# Patient Record
Sex: Male | Born: 1955 | Hispanic: No | Marital: Married | Smoking: Never smoker
Health system: Southern US, Community
[De-identification: ages and names within clinical notes are randomized; demographics above are authoritative.]

## PROBLEM LIST (undated history)

## (undated) DIAGNOSIS — I2510 Atherosclerotic heart disease of native coronary artery without angina pectoris: Secondary | ICD-10-CM

## (undated) DIAGNOSIS — I509 Heart failure, unspecified: Secondary | ICD-10-CM

## (undated) DIAGNOSIS — I10 Essential (primary) hypertension: Secondary | ICD-10-CM

## (undated) DIAGNOSIS — E119 Type 2 diabetes mellitus without complications: Secondary | ICD-10-CM

## (undated) HISTORY — PX: NO PAST SURGERIES: SHX2092

---

## 2006-03-03 ENCOUNTER — Emergency Department (HOSPITAL_COMMUNITY): Admission: EM | Admit: 2006-03-03 | Discharge: 2006-03-04 | Payer: Self-pay | Admitting: Emergency Medicine

## 2013-06-25 ENCOUNTER — Encounter (HOSPITAL_COMMUNITY): Payer: Self-pay | Admitting: Emergency Medicine

## 2013-06-25 ENCOUNTER — Inpatient Hospital Stay (HOSPITAL_COMMUNITY)
Admission: EM | Admit: 2013-06-25 | Discharge: 2013-06-27 | DRG: 291 | Disposition: A | Discharge status: Home / Self Care | Payer: BC Managed Care – PPO | Attending: Cardiovascular Disease | Admitting: Cardiovascular Disease

## 2013-06-25 ENCOUNTER — Other Ambulatory Visit: Payer: Self-pay

## 2013-06-25 ENCOUNTER — Emergency Department (HOSPITAL_COMMUNITY): Payer: BC Managed Care – PPO

## 2013-06-25 DIAGNOSIS — I2510 Atherosclerotic heart disease of native coronary artery without angina pectoris: Secondary | ICD-10-CM | POA: Diagnosis present

## 2013-06-25 DIAGNOSIS — E119 Type 2 diabetes mellitus without complications: Secondary | ICD-10-CM | POA: Diagnosis present

## 2013-06-25 DIAGNOSIS — E860 Dehydration: Secondary | ICD-10-CM | POA: Diagnosis present

## 2013-06-25 DIAGNOSIS — J189 Pneumonia, unspecified organism: Secondary | ICD-10-CM | POA: Diagnosis present

## 2013-06-25 DIAGNOSIS — I309 Acute pericarditis, unspecified: Secondary | ICD-10-CM | POA: Diagnosis present

## 2013-06-25 DIAGNOSIS — I5023 Acute on chronic systolic (congestive) heart failure: Principal | ICD-10-CM | POA: Diagnosis present

## 2013-06-25 DIAGNOSIS — I129 Hypertensive chronic kidney disease with stage 1 through stage 4 chronic kidney disease, or unspecified chronic kidney disease: Secondary | ICD-10-CM | POA: Diagnosis present

## 2013-06-25 DIAGNOSIS — IMO0001 Reserved for inherently not codable concepts without codable children: Secondary | ICD-10-CM | POA: Diagnosis present

## 2013-06-25 DIAGNOSIS — I498 Other specified cardiac arrhythmias: Secondary | ICD-10-CM | POA: Diagnosis not present

## 2013-06-25 DIAGNOSIS — I509 Heart failure, unspecified: Secondary | ICD-10-CM | POA: Diagnosis present

## 2013-06-25 DIAGNOSIS — N182 Chronic kidney disease, stage 2 (mild): Secondary | ICD-10-CM | POA: Diagnosis present

## 2013-06-25 DIAGNOSIS — Z9119 Patient's noncompliance with other medical treatment and regimen: Secondary | ICD-10-CM | POA: Diagnosis present

## 2013-06-25 DIAGNOSIS — I059 Rheumatic mitral valve disease, unspecified: Secondary | ICD-10-CM | POA: Diagnosis present

## 2013-06-25 DIAGNOSIS — R5381 Other malaise: Secondary | ICD-10-CM | POA: Diagnosis present

## 2013-06-25 DIAGNOSIS — Z91199 Patient's noncompliance with other medical treatment and regimen due to unspecified reason: Secondary | ICD-10-CM | POA: Diagnosis present

## 2013-06-25 DIAGNOSIS — R188 Other ascites: Secondary | ICD-10-CM | POA: Diagnosis present

## 2013-06-25 DIAGNOSIS — I428 Other cardiomyopathies: Secondary | ICD-10-CM | POA: Diagnosis present

## 2013-06-25 DIAGNOSIS — I079 Rheumatic tricuspid valve disease, unspecified: Secondary | ICD-10-CM | POA: Diagnosis present

## 2013-06-25 DIAGNOSIS — N289 Disorder of kidney and ureter, unspecified: Secondary | ICD-10-CM | POA: Diagnosis present

## 2013-06-25 HISTORY — DX: Type 2 diabetes mellitus without complications: E11.9

## 2013-06-25 HISTORY — DX: Atherosclerotic heart disease of native coronary artery without angina pectoris: I25.10

## 2013-06-25 HISTORY — DX: Essential (primary) hypertension: I10

## 2013-06-25 HISTORY — DX: Heart failure, unspecified: I50.9

## 2013-06-25 LAB — CBC WITH DIFFERENTIAL/PLATELET
Basophils Absolute: 0 10*3/uL (ref 0.0–0.1)
Basophils Relative: 0 % (ref 0–1)
Eosinophils Absolute: 0.3 10*3/uL (ref 0.0–0.7)
Eosinophils Relative: 3 % (ref 0–5)
HCT: 38.5 % — ABNORMAL LOW (ref 39.0–52.0)
Hemoglobin: 13.2 g/dL (ref 13.0–17.0)
Lymphocytes Relative: 15 % (ref 12–46)
Lymphs Abs: 1.5 10*3/uL (ref 0.7–4.0)
MCH: 32.9 pg (ref 26.0–34.0)
MCHC: 34.3 g/dL (ref 30.0–36.0)
MCV: 96 fL (ref 78.0–100.0)
Monocytes Absolute: 0.5 10*3/uL (ref 0.1–1.0)
Monocytes Relative: 5 % (ref 3–12)
Neutro Abs: 7.3 10*3/uL (ref 1.7–7.7)
Neutrophils Relative %: 76 % (ref 43–77)
Platelets: 311 10*3/uL (ref 150–400)
RBC: 4.01 MIL/uL — ABNORMAL LOW (ref 4.22–5.81)
RDW: 15.8 % — ABNORMAL HIGH (ref 11.5–15.5)
WBC: 9.7 10*3/uL (ref 4.0–10.5)

## 2013-06-25 LAB — URINALYSIS, ROUTINE W REFLEX MICROSCOPIC
Bilirubin Urine: NEGATIVE
Glucose, UA: 100 mg/dL — AB
Ketones, ur: NEGATIVE mg/dL
Leukocytes, UA: NEGATIVE
Nitrite: NEGATIVE
Protein, ur: 100 mg/dL — AB
Specific Gravity, Urine: 1.011 (ref 1.005–1.030)
Urobilinogen, UA: 0.2 mg/dL (ref 0.0–1.0)
pH: 7 (ref 5.0–8.0)

## 2013-06-25 LAB — URINE MICROSCOPIC-ADD ON

## 2013-06-25 LAB — COMPREHENSIVE METABOLIC PANEL
ALT: 10 U/L (ref 0–53)
AST: 13 U/L (ref 0–37)
Albumin: 3.7 g/dL (ref 3.5–5.2)
Alkaline Phosphatase: 83 U/L (ref 39–117)
BUN: 11 mg/dL (ref 6–23)
CO2: 26 mEq/L (ref 19–32)
Calcium: 9 mg/dL (ref 8.4–10.5)
Chloride: 102 mEq/L (ref 96–112)
Creatinine, Ser: 1.28 mg/dL (ref 0.50–1.35)
GFR calc Af Amer: 69 mL/min — ABNORMAL LOW (ref >90)
GFR calc non Af Amer: 60 mL/min — ABNORMAL LOW (ref >90)
Glucose, Bld: 133 mg/dL — ABNORMAL HIGH (ref 70–99)
Potassium: 3.7 mEq/L (ref 3.5–5.1)
Sodium: 138 mEq/L (ref 135–145)
Total Bilirubin: 1.4 mg/dL — ABNORMAL HIGH (ref 0.3–1.2)
Total Protein: 7.8 g/dL (ref 6.0–8.3)

## 2013-06-25 LAB — PRO B NATRIURETIC PEPTIDE: Pro B Natriuretic peptide (BNP): 1691 pg/mL — ABNORMAL HIGH (ref 0–125)

## 2013-06-25 LAB — HEMOGLOBIN A1C: Mean Plasma Glucose: 169 mg/dL — ABNORMAL HIGH (ref <117)

## 2013-06-25 LAB — GLUCOSE, CAPILLARY

## 2013-06-25 LAB — TROPONIN I: Troponin I: <0.3 ng/mL (ref <0.30)

## 2013-06-25 MED ORDER — FUROSEMIDE 10 MG/ML IJ SOLN
60.0000 mg | Freq: Once | INTRAMUSCULAR | Status: AC
  Administered 2013-06-25: 60 mg via INTRAVENOUS
  Filled 2013-06-25 (×2): qty 6

## 2013-06-25 MED ORDER — POTASSIUM CHLORIDE CRYS ER 20 MEQ PO TBCR
20.0000 meq | EXTENDED_RELEASE_TABLET | Freq: Once | ORAL | Status: AC
  Administered 2013-06-25: 20 meq via ORAL
  Filled 2013-06-25: qty 1

## 2013-06-25 MED ORDER — HEPARIN SODIUM (PORCINE) 5000 UNIT/ML IJ SOLN
5000.0000 [IU] | Freq: Three times a day (TID) | INTRAMUSCULAR | Status: DC
  Administered 2013-06-25 – 2013-06-27 (×5): 5000 [IU] via SUBCUTANEOUS
  Filled 2013-06-25 (×8): qty 1

## 2013-06-25 MED ORDER — INSULIN ASPART 100 UNIT/ML ~~LOC~~ SOLN
0.0000 [IU] | Freq: Three times a day (TID) | SUBCUTANEOUS | Status: DC
  Administered 2013-06-25: 17:00:00 2 [IU] via SUBCUTANEOUS
  Administered 2013-06-26: 12:00:00 5 [IU] via SUBCUTANEOUS
  Administered 2013-06-26 (×2): 3 [IU] via SUBCUTANEOUS

## 2013-06-25 MED ORDER — FUROSEMIDE 10 MG/ML IJ SOLN
40.0000 mg | Freq: Two times a day (BID) | INTRAMUSCULAR | Status: DC
  Administered 2013-06-25 – 2013-06-27 (×4): 40 mg via INTRAVENOUS
  Filled 2013-06-25 (×6): qty 4

## 2013-06-25 MED ORDER — INSULIN GLARGINE 100 UNIT/ML ~~LOC~~ SOLN
10.0000 [IU] | Freq: Every day | SUBCUTANEOUS | Status: DC
  Administered 2013-06-25: 10 [IU] via SUBCUTANEOUS
  Filled 2013-06-25 (×2): qty 0.1

## 2013-06-25 MED ORDER — SODIUM CHLORIDE 0.9 % IJ SOLN: 3.0000 mL | INTRAMUSCULAR | Status: DC | PRN

## 2013-06-25 MED ORDER — SODIUM CHLORIDE 0.9 % IV SOLN: 250.0000 mL | INTRAVENOUS | Status: DC | PRN

## 2013-06-25 MED ORDER — DIGOXIN 250 MCG PO TABS
0.2500 mg | ORAL_TABLET | Freq: Every day | ORAL | Status: DC
  Administered 2013-06-25 – 2013-06-27 (×3): 0.25 mg via ORAL
  Filled 2013-06-25 (×3): qty 1

## 2013-06-25 MED ORDER — BENAZEPRIL HCL 40 MG PO TABS
40.0000 mg | ORAL_TABLET | Freq: Every day | ORAL | Status: DC
  Administered 2013-06-25 – 2013-06-27 (×3): 40 mg via ORAL
  Filled 2013-06-25 (×3): qty 1

## 2013-06-25 MED ORDER — POTASSIUM CHLORIDE CRYS ER 20 MEQ PO TBCR
20.0000 meq | EXTENDED_RELEASE_TABLET | Freq: Two times a day (BID) | ORAL | Status: DC
  Administered 2013-06-25 – 2013-06-26 (×2): 20 meq via ORAL
  Filled 2013-06-25 (×4): qty 1

## 2013-06-25 MED ORDER — CARVEDILOL 25 MG PO TABS
25.0000 mg | ORAL_TABLET | Freq: Two times a day (BID) | ORAL | Status: DC
  Administered 2013-06-25 – 2013-06-27 (×4): 25 mg via ORAL
  Filled 2013-06-25 (×6): qty 1

## 2013-06-25 MED ORDER — SPIRONOLACTONE 25 MG PO TABS
25.0000 mg | ORAL_TABLET | Freq: Every day | ORAL | Status: DC
  Administered 2013-06-25 – 2013-06-27 (×3): 25 mg via ORAL
  Filled 2013-06-25 (×3): qty 1

## 2013-06-25 MED ORDER — AMLODIPINE BESYLATE 5 MG PO TABS
5.0000 mg | ORAL_TABLET | Freq: Every day | ORAL | Status: DC
  Administered 2013-06-25: 5 mg via ORAL
  Filled 2013-06-25 (×2): qty 1

## 2013-06-25 MED ORDER — ACETAMINOPHEN 325 MG PO TABS: 650.0000 mg | ORAL_TABLET | ORAL | Status: DC | PRN

## 2013-06-25 MED ORDER — ONDANSETRON HCL 4 MG/2ML IJ SOLN: 4.0000 mg | Freq: Four times a day (QID) | INTRAMUSCULAR | Status: DC | PRN

## 2013-06-25 MED ORDER — SODIUM CHLORIDE 0.9 % IJ SOLN
3.0000 mL | Freq: Two times a day (BID) | INTRAMUSCULAR | Status: DC
Start: 2013-06-25 — End: 2013-06-27
  Administered 2013-06-25 – 2013-06-26 (×4): 3 mL via INTRAVENOUS

## 2013-06-25 MED ORDER — GUAIFENESIN 100 MG/5ML PO SOLN
5.0000 mL | ORAL | Status: DC | PRN
  Administered 2013-06-26 (×2): 100 mg via ORAL
  Filled 2013-06-25 (×2): qty 5

## 2013-06-25 MED ORDER — PNEUMOCOCCAL VAC POLYVALENT 25 MCG/0.5ML IJ INJ
0.5000 mL | INJECTION | INTRAMUSCULAR | Status: AC
  Administered 2013-06-26: 10:00:00 0.5 mL via INTRAMUSCULAR
  Filled 2013-06-25: qty 0.5

## 2013-06-25 NOTE — ED Notes (Signed)
Report given to Lori, RN

## 2013-06-25 NOTE — ED Provider Notes (Signed)
CSN: [id number]     Arrival date & time 06/25/13  1478 History   None    Chief Complaint  Patient presents with  . Weakness  . Shortness of Breath   (Consider location/radiation/quality/duration/timing/severity/associated sxs/prior Treatment) HPI Mr. Caleb [name] is a 57 y.o. [place] male w/ PMHx of CAD, CHF, HTN, DM type II, presents to the ED w/ complaints of weakness and SOB. Patient was seen by Dr. Algie Coffer in his office last weak and the patient looked dehydrated. The patient is accompanied by his daughter who assisted w/ history. The patient claims to have recent fatigue, mostly w/ exertion as well as DOE and dry mouth as well as a mild dry cough. He also states that he has had dizziness and lightheadedness w/ standing recently. He denies any recent vomiting or diarrhea, but does claim to have somewhat decreased po intake recently as he has not been feeling well. He claims to urinate 2-3x per hour, which is apparently normal for him. After speaking w/ Dr. Algie Coffer, the patient is non-compliant w/ his insulin regimen and frequently has very high sugars, most likely contributing to his possible polyuria. The patient also complains of PND and 2-3 pillow orthopnea, not a new complaint for him.  Past Medical History  Diagnosis Date  . CHF (congestive heart failure)   . Hypertension   . Coronary artery disease   . Diabetes mellitus without complication    No past surgical history on file. No family history on file. History  Substance Use Topics  . Smoking status: Never Smoker   . Smokeless tobacco: Not on file  . Alcohol Use: Not on file     Comment: occ    Review of Systems General: Positive for fatigue. Denies fever, chills, diaphoresis, appetite change.  Respiratory: Positive for SOB, DOE, and mild dry cough. Denies chest tightness, and wheezing.   Cardiovascular: Positive for leg swelling, R>L. Denies chest pain, palpitations. Gastrointestinal: Denies nausea, vomiting, abdominal  pain, diarrhea, constipation, blood in stool and abdominal distention.  Genitourinary: Denies dysuria, urgency, frequency, hematuria, flank pain and difficulty urinating.  Endocrine: Positive for polyuria. Denies hot or cold intolerance, sweats. Musculoskeletal: Denies myalgias, back pain, joint swelling, arthralgias and gait problem.  Skin: Denies pallor, rash and wounds.  Neurological: Positive for weakness, mild dizziness and lightheadedness w/ standing. Denies seizures, syncope, numbness and headaches.  Psychiatric/Behavioral: Denies mood changes, confusion, nervousness, sleep disturbance and agitation.  Allergies  Sulfa antibiotics  Home Medications   Current Outpatient Rx  Name  Route  Sig  Dispense  Refill  . benazepril (LOTENSIN) 40 MG tablet   Oral   Take 40 mg by mouth daily.         Marland Kitchen OVER THE COUNTER MEDICATION   Oral   Take 1 tablet by mouth daily. For reflux           Physical Exam Filed Vitals:   06/25/13 1038 06/25/13 1039 06/25/13 1131 06/25/13 1214  BP: 165/89 157/83 149/91 149/81  Pulse: 102 100    Temp:      TempSrc:      Resp:   23 22  SpO2:   96% 96%   General: Vital signs reviewed.  Patient is a well-developed and well-nourished, in no acute distress and cooperative with exam. Alert and oriented x3. No orthostatic changes.  Head: Normocephalic and atraumatic. Eyes: PERRL, EOMI, conjunctivae normal, No scleral icterus.  Neck: Supple, trachea midline, normal ROM, No JVD, masses, thyromegaly, or carotid bruit present.  Cardiovascular:  Mildly tachycardic, regular rhythm, S1 normal, S2 normal, no murmurs, gallops, or rubs. Pulmonary/Chest: Slightly tachypneic. Normal respiratory effort, CTAB, no wheezes, rales, or rhonchi. Abdominal: Soft, non-tender, non-distended, bowel sounds are normal, no masses, organomegaly, or guarding present.  Musculoskeletal: No joint deformities, erythema, or stiffness, ROM full and no nontender. Extremities: +2 pitting  edema in the LE, R>L. No erythema or temperature difference b/t extremities. Pulses symmetric and intact bilaterally. No cyanosis or clubbing. Neurological: A&O x3, Strength is normal and symmetric bilaterally, cranial nerve II-XII are grossly intact, no focal motor deficit, sensory intact to light touch bilaterally.  Skin: Warm, dry and intact. No rashes or erythema. Psychiatric: Normal mood and affect. speech and behavior is normal. Cognition and memory are normal.   ED Course  Procedures (including critical care time) Labs Review Labs Reviewed  PRO B NATRIURETIC PEPTIDE - Abnormal; Notable for the following:    Pro B Natriuretic peptide (BNP) 1691.0 (*)    All other components within normal limits  CBC WITH DIFFERENTIAL - Abnormal; Notable for the following:    RBC 4.01 (*)    HCT 38.5 (*)    RDW 15.8 (*)    All other components within normal limits  COMPREHENSIVE METABOLIC PANEL - Abnormal; Notable for the following:    Glucose, Bld 133 (*)    Total Bilirubin 1.4 (*)    GFR calc non Af Amer 60 (*)    GFR calc Af Amer 69 (*)    All other components within normal limits  URINALYSIS, ROUTINE W REFLEX MICROSCOPIC - Abnormal; Notable for the following:    Glucose, UA 100 (*)    Hgb urine dipstick SMALL (*)    Protein, ur 100 (*)    All other components within normal limits  URINE MICROSCOPIC-ADD ON  POCT I-STAT TROPONIN I   Imaging Review Dg Chest 2 View  06/25/2013   CLINICAL DATA:  Weakness.  Shortness of breath.  EXAM: CHEST  2 VIEW  COMPARISON:  None.  FINDINGS: There is opacity at the left lung base partly silhouetting the hemidiaphragm. This may reflect pneumonia. It could reflect atelectasis or even in part be due to asymmetric edema.  There is mild thickening of the central interstitial markings as well as the fissures. This may be chronic or reflect mild interstitial edema.  No pleural effusion or pneumothorax.  Cardiac silhouette is moderately enlarged.  IMPRESSION: 1. Left  lung base opacity may reflect pneumonia or other infiltrate. Alternatively, could reflect atelectasis or even asymmetric edema or a combination. 2. Cardiomegaly and central interstitial thickening with thickening of the fissures raises the possibility of mild interstitial edema/congestive heart failure.   Electronically Signed   By: Amie Portland M.D.   On: 06/25/2013 12:03    EKG Interpretation    Date/Time:  Monday June 25 2013 09:54:17 EST Ventricular Rate:  103 PR Interval:  168 QRS Duration: 80 QT Interval:  356 QTC Calculation: 466 R Axis:   -62 Text Interpretation:  Sinus tachycardia LOW VOLTAGE Inferior injury pattern Anterior injury pattern Non-specific ST-t changes No old tracing to compare Confirmed by Juleen China  MD, STEPHEN ([id number]) on 06/25/2013 10:03:03 AM            MDM   Mr. Arlynn Mcdermid is a 57 y.o. male w/ PMHx of CAD (3 previous stents), CHF (EF ~40%), HTN, DM type II (uncontrolled), presents to the ED w/ complaints of weakness and SOB. Patient recently seen in Dr. Roseanne Kaufman office where he appeared dehydrated. Today,  patient still w/ dry mouth and complaints of mild lightheadedness w/ standing, however, patient's primary complaints are general weakness, fatigue, and DOE. Patient w/ out rales on exam, however, patient has pitting edema in the LE's. Fluid status generally unclear at this time. He does state he urinates 2-3x hourly, which is not new for him. He appears to be slightly tachycardic, tachypneic and satting in the mid-90's. Well's score is low probability of PE. Patient's symptoms did not occur acutely.  -EKG shows sinus tachycardia, w/ low voltage and non-specific ST/T-wave changes.  -CXR shows a left lung base opacity may reflect pneumonia or other infiltrate or could reflect atelectasis or even asymmetric edema or a combination. Also showed cardiomegaly and central interstitial thickening with thickening  of the fissures reflecting possibility of mild  interstitial edema/congestive heart failure. -Give Lasix IV 60 mg + K-dur 20 mEq. -CBC w/ diff wnl. Patient does not appear hemoconcentrated, no leukocytosis. -CMET w/ total bili of 1.4, otherwise wnl. Cr 1.28. No sodium or potassium abnormalities suggestive of dehydration. Glucose 133.  -pBNP 1691.  -UA w/ small Hb, 100 protein and 100 glucose, otherwise wnl. -poc troponin -ve.  Discussed w/ Dr. Algie Coffer, will admit patient.  Courtney Paris, MD 06/25/13 [phone]

## 2013-06-25 NOTE — ED Notes (Signed)
Pt is here with weakness and increased sob.  Pt has orders for blood work from Dr. Algie Coffer.  Pt was too weak to make it to get blood drawn

## 2013-06-25 NOTE — ED Notes (Signed)
Pt states he had his flu shot last Wednesday, ever since has been having increased weakness, cough, fevers, body aches. Some nausea, diarrhea, no vomiting. No pain on assessment. Productive cough. States shortness of breath worse when lies back. Family at bedside. Hx of CHF, sent by Dr. Algie Coffer to have blood drawn.

## 2013-06-25 NOTE — H&P (Signed)
Caleb [name] is an 57 y.o. male.   Chief Complaint: Shortness of breath HPI: 57 y.o. Asian American male w/ PMHx of CAD, CHF, HTN, DM type II, presents to the ED w/ complaints of weakness and SOB. Patient was seen by me in his office last weak and the patient looked dehydrated. The patient is accompanied by his daughter who assisted w/ history. The patient claims to have recent fatigue, mostly w/ exertion as well as DOE and dry mouth as well as a mild dry cough. He also states that he has had dizziness and lightheadedness w/ standing recently. He denies any recent vomiting or diarrhea, but does claim to have somewhat decreased po intake recently as he has not been feeling well. He claims to urinate 2-3 x per hour, which is apparently normal for him. the patient is non-compliant w/ his insulin regimen and frequently has very high sugars, most likely contributing to his possible polyuria. The patient also complains of PND and 2-3 pillow orthopnea, not a new complaint for him.   Past Medical History  Diagnosis Date  . CHF (congestive heart failure)   . Hypertension   . Coronary artery disease   . Diabetes mellitus without complication       No past surgical history on file.  No family history on file. Social History:  reports that he has never smoked. He does not have any smokeless tobacco history on file. He reports that he does not use illicit drugs. His alcohol history is not on file.  Allergies:  Allergies  Allergen Reactions  . Sulfa Antibiotics      (Not in a hospital admission)  Results for orders placed during the hospital encounter of 06/25/13 (from the past 48 hour(s))  PRO B NATRIURETIC PEPTIDE     Status: Abnormal   Collection Time    06/25/13 10:10 AM      Result Value Range   Pro B Natriuretic peptide (BNP) 1691.0 (*) 0 - 125 pg/mL  CBC WITH DIFFERENTIAL     Status: Abnormal   Collection Time    06/25/13 10:10 AM      Result Value Range   WBC 9.7  4.0 - 10.5 K/uL    RBC 4.01 (*) 4.22 - 5.81 MIL/uL   Hemoglobin 13.2  13.0 - 17.0 g/dL   HCT 30.8 (*) 65.7 - 84.6 %   MCV 96.0  78.0 - 100.0 fL   MCH 32.9  26.0 - 34.0 pg   MCHC 34.3  30.0 - 36.0 g/dL   RDW 96.2 (*) 95.2 - 84.1 %   Platelets 311  150 - 400 K/uL   Neutrophils Relative % 76  43 - 77 %   Neutro Abs 7.3  1.7 - 7.7 K/uL   Lymphocytes Relative 15  12 - 46 %   Lymphs Abs 1.5  0.7 - 4.0 K/uL   Monocytes Relative 5  3 - 12 %   Monocytes Absolute 0.5  0.1 - 1.0 K/uL   Eosinophils Relative 3  0 - 5 %   Eosinophils Absolute 0.3  0.0 - 0.7 K/uL   Basophils Relative 0  0 - 1 %   Basophils Absolute 0.0  0.0 - 0.1 K/uL  COMPREHENSIVE METABOLIC PANEL     Status: Abnormal   Collection Time    06/25/13 10:10 AM      Result Value Range   Sodium 138  135 - 145 mEq/L   Potassium 3.7  3.5 - 5.1 mEq/L   Chloride  102  96 - 112 mEq/L   CO2 26  19 - 32 mEq/L   Glucose, Bld 133 (*) 70 - 99 mg/dL   BUN 11  6 - 23 mg/dL   Creatinine, Ser 0.45  0.50 - 1.35 mg/dL   Calcium 9.0  8.4 - 40.9 mg/dL   Total Protein 7.8  6.0 - 8.3 g/dL   Albumin 3.7  3.5 - 5.2 g/dL   AST 13  0 - 37 U/L   ALT 10  0 - 53 U/L   Alkaline Phosphatase 83  39 - 117 U/L   Total Bilirubin 1.4 (*) 0.3 - 1.2 mg/dL   GFR calc non Af Amer 60 (*) >90 mL/min   GFR calc Af Amer 69 (*) >90 mL/min   Comment: (NOTE)     The eGFR has been calculated using the CKD EPI equation.     This calculation has not been validated in all clinical situations.     eGFR's persistently <90 mL/min signify possible Chronic Kidney     Disease.  POCT I-STAT TROPONIN I     Status: None   Collection Time    06/25/13 10:22 AM      Result Value Range   Troponin i, poc 0.01  0.00 - 0.08 ng/mL   Comment 3            Comment: Due to the release kinetics of cTnI,     a negative result within the first hours     of the onset of symptoms does not rule out     myocardial infarction with certainty.     If myocardial infarction is still suspected,     repeat the test at  appropriate intervals.  URINALYSIS, ROUTINE W REFLEX MICROSCOPIC     Status: Abnormal   Collection Time    06/25/13 11:20 AM      Result Value Range   Color, Urine YELLOW  YELLOW   APPearance CLEAR  CLEAR   Specific Gravity, Urine 1.011  1.005 - 1.030   pH 7.0  5.0 - 8.0   Glucose, UA 100 (*) NEGATIVE mg/dL   Hgb urine dipstick SMALL (*) NEGATIVE   Bilirubin Urine NEGATIVE  NEGATIVE   Ketones, ur NEGATIVE  NEGATIVE mg/dL   Protein, ur 811 (*) NEGATIVE mg/dL   Urobilinogen, UA 0.2  0.0 - 1.0 mg/dL   Nitrite NEGATIVE  NEGATIVE   Leukocytes, UA NEGATIVE  NEGATIVE  URINE MICROSCOPIC-ADD ON     Status: None   Collection Time    06/25/13 11:20 AM      Result Value Range   Squamous Epithelial / LPF RARE  RARE   WBC, UA 0-2  <3 WBC/hpf   RBC / HPF 0-2  <3 RBC/hpf   Bacteria, UA RARE  RARE   Dg Chest 2 View  06/25/2013   CLINICAL DATA:  Weakness.  Shortness of breath.  EXAM: CHEST  2 VIEW  COMPARISON:  None.  FINDINGS: There is opacity at the left lung base partly silhouetting the hemidiaphragm. This may reflect pneumonia. It could reflect atelectasis or even in part be due to asymmetric edema.  There is mild thickening of the central interstitial markings as well as the fissures. This may be chronic or reflect mild interstitial edema.  No pleural effusion or pneumothorax.  Cardiac silhouette is moderately enlarged.  IMPRESSION: 1. Left lung base opacity may reflect pneumonia or other infiltrate. Alternatively, could reflect atelectasis or even asymmetric edema or a combination. 2.  Cardiomegaly and central interstitial thickening with thickening of the fissures raises the possibility of mild interstitial edema/congestive heart failure.   Electronically Signed   By: Amie Portland M.D.   On: 06/25/2013 12:03    ROS General: Positive for fatigue. Denies fever, chills, diaphoresis, appetite change.  Respiratory: Positive for SOB, DOE, and mild dry cough. Denies chest tightness, and wheezing.   Cardiovascular: Positive for leg swelling, R>L. Denies chest pain, palpitations.  Gastrointestinal: Denies nausea, vomiting, abdominal pain, diarrhea, constipation, blood in stool and abdominal distention.  Genitourinary: Denies dysuria, urgency, frequency, hematuria, flank pain and difficulty urinating.  Endocrine: Positive for polyuria. Denies hot or cold intolerance, sweats.  Musculoskeletal: Denies myalgias, back pain, joint swelling, arthralgias and gait problem.  Skin: Denies pallor, rash and wounds.  Neurological: Positive for weakness, mild dizziness and lightheadedness w/ standing. Denies seizures, syncope, numbness and headaches.  Psychiatric/Behavioral: Denies mood changes, confusion, nervousness, sleep disturbance and agitation.  Blood pressure 152/84, pulse 91, temperature 97.3 F (36.3 C), temperature source Oral, resp. rate 22, SpO2 96.00%. General: Vital signs reviewed. Patient is a well-developed and well-nourished, in no acute distress and cooperative with exam. Alert and oriented x3. No orthostatic changes.  Head: Normocephalic and atraumatic.  Eyes: PERRL, EOMI, conjunctivae normal, No scleral icterus.  Neck: Supple, trachea midline, normal ROM, No JVD, masses, thyromegaly, or carotid bruit present.  Cardiovascular: Mildly tachycardic, regular rhythm, S1 normal, S2 normal, no murmurs, gallops, or rubs.  Pulmonary/Chest: Slightly tachypneic. Normal respiratory effort, CTAB, no wheezes, rales, or rhonchi.  Abdominal: Soft, non-tender, non-distended, bowel sounds are normal, no masses, organomegaly, or guarding present.  Musculoskeletal: No joint deformities, erythema, or stiffness, ROM full and no nontender.  Extremities: +2 pitting edema in the LE, R>L. No erythema or temperature difference b/t extremities. Pulses symmetric and intact bilaterally. No cyanosis or clubbing. Neurological: A&O x3, Strength is normal and symmetric bilaterally, cranial nerve II-XII are grossly  intact, no focal motor deficit, sensory intact to light touch bilaterally.  Skin: Warm, dry and intact. No rashes or erythema. Psychiatric: Normal mood and affect. speech and behavior is normal. Cognition and memory are normal.     Assessment/Plan Acute on chronic left heart systolic failure DM, II Dilated cardiomyopathy CAD S/P stent Tricuspid valve regurgitation  Admit Lasix Oxygen Echocardiogram Ace-inhibitor, B-blocker, Lanoxin.  Caleb [name] S 06/25/2013, 1:40 PM

## 2013-06-25 NOTE — Progress Notes (Signed)
  Echocardiogram 2D Echocardiogram has been performed.  Caleb [name] FRANCES 06/25/2013, 6:14 PM

## 2013-06-25 NOTE — ED Notes (Signed)
Pharmacy notified Lasix needed.

## 2013-06-25 NOTE — Progress Notes (Signed)
Patient has arrived to unit, vital signs are stable and patient assessment complete; will continue to monitor patient. Lorretta Harp RN

## 2013-06-26 LAB — GLUCOSE, CAPILLARY
Glucose-Capillary: 180 mg/dL — ABNORMAL HIGH (ref 70–99)
Glucose-Capillary: 237 mg/dL — ABNORMAL HIGH (ref 70–99)
Glucose-Capillary: 181 mg/dL — ABNORMAL HIGH (ref 70–99)
Glucose-Capillary: 208 mg/dL — ABNORMAL HIGH (ref 70–99)

## 2013-06-26 LAB — TROPONIN I: Troponin I: <0.3 ng/mL (ref <0.30)

## 2013-06-26 LAB — BASIC METABOLIC PANEL
BUN: 12 mg/dL (ref 6–23)
Chloride: 102 mEq/L (ref 96–112)
Creatinine, Ser: 1.37 mg/dL — ABNORMAL HIGH (ref 0.50–1.35)
GFR calc non Af Amer: 55 mL/min — ABNORMAL LOW (ref >90)
Glucose, Bld: 201 mg/dL — ABNORMAL HIGH (ref 70–99)
Potassium: 3.6 mEq/L (ref 3.5–5.1)

## 2013-06-26 LAB — TSH: TSH: 1.566 u[IU]/mL (ref 0.350–4.500)

## 2013-06-26 LAB — URIC ACID: Uric Acid, Serum: 7.2 mg/dL (ref 4.0–7.8)

## 2013-06-26 MED ORDER — INSULIN GLARGINE 100 UNIT/ML ~~LOC~~ SOLN
20.0000 [IU] | Freq: Every day | SUBCUTANEOUS | Status: DC
  Administered 2013-06-26: 20 [IU] via SUBCUTANEOUS
  Filled 2013-06-26 (×2): qty 0.2

## 2013-06-26 MED ORDER — POTASSIUM CHLORIDE CRYS ER 20 MEQ PO TBCR
20.0000 meq | EXTENDED_RELEASE_TABLET | Freq: Three times a day (TID) | ORAL | Status: DC
  Administered 2013-06-26 – 2013-06-27 (×4): 20 meq via ORAL
  Filled 2013-06-26 (×5): qty 1

## 2013-06-26 MED ORDER — AZITHROMYCIN 250 MG PO TABS
250.0000 mg | ORAL_TABLET | Freq: Every day | ORAL | Status: DC
  Administered 2013-06-27: 250 mg via ORAL
  Filled 2013-06-26: qty 1

## 2013-06-26 MED ORDER — HYDROCOD POLST-CHLORPHEN POLST 10-8 MG/5ML PO LQCR
5.0000 mL | Freq: Four times a day (QID) | ORAL | Status: DC | PRN
  Administered 2013-06-26: 5 mL via ORAL
  Filled 2013-06-26: qty 5

## 2013-06-26 MED ORDER — ALBUMIN HUMAN 25 % IV SOLN
12.5000 g | Freq: Once | INTRAVENOUS | Status: AC
  Administered 2013-06-26: 11:00:00 12.5 g via INTRAVENOUS
  Filled 2013-06-26: qty 50

## 2013-06-26 MED ORDER — AMLODIPINE BESYLATE 2.5 MG PO TABS
2.5000 mg | ORAL_TABLET | Freq: Every day | ORAL | Status: DC
  Administered 2013-06-26 – 2013-06-27 (×2): 2.5 mg via ORAL
  Filled 2013-06-26 (×2): qty 1

## 2013-06-26 MED ORDER — AZITHROMYCIN 500 MG PO TABS
500.0000 mg | ORAL_TABLET | Freq: Every day | ORAL | Status: AC
  Administered 2013-06-26: 18:00:00 500 mg via ORAL
  Filled 2013-06-26: qty 1

## 2013-06-26 NOTE — ED Provider Notes (Signed)
I saw and evaluated the patient, reviewed the resident's note and I agree with the findings and plan.  EKG Interpretation    Date/Time:  Monday June 25 2013 09:54:17 EST Ventricular Rate:  103 PR Interval:  168 QRS Duration: 80 QT Interval:  356 QTC Calculation: 466 R Axis:   -62 Text Interpretation:  Sinus tachycardia LOW VOLTAGE Inferior injury pattern Anterior injury pattern Non-specific ST-t changes No old tracing to compare Confirmed by Shermon Bozzi  MD, Sahasra Belue ([id number]) on 06/25/2013 10:03:03 AM            57yM with generalized weakness and SOB. Likely multifactorial, but suspect large component of HF. Admit for further management.   Raeford Razor, MD 06/26/13 [phone]

## 2013-06-26 NOTE — Clinical Documentation Improvement (Signed)
THIS DOCUMENT IS NOT A PERMANENT PART OF THE MEDICAL RECORD  Please update your documentation with the medical record to reflect your response to this query. If you need help knowing how to do this please call [phone].  06/26/13   Dear Dr.Jai Bear/Associates,  In a better effort to capture your patient's severity of illness, reflect appropriate length of stay and utilization of resources, a review of the patient medical record has revealed the following indicators.    Based on your clinical judgment, please clarify and document in a progress note and/or discharge summary the clinical condition associated with the following supporting information:  In responding to this query please exercise your independent judgment.  The fact that a query is asked, does not imply that any particular answer is desired or expected.  Possible Clinical Conditions?   "   CKD Stage II - GFR 60-80  "   CKD Stage III - GFR 30-59  "   Other condition  "   Cannot Clinically determine    Risk Factors:  Acute renal insufficiency noted per 12/09 progress notes.  Diagnostics:  Lab:  Bun: 12/09:  12 12/08:  11  Creatinine: 12/09:  1.37 12/08:  1.28   You may use possible, probable, or suspect with inpatient documentation. possible, probable, suspected diagnoses MUST be documented at the time of discharge  Reviewed: additional documentation in the medical record   Thank You,  Marciano Sequin, Clinical Documentation Specialist: [phone] Health Information Management [place]

## 2013-06-26 NOTE — Progress Notes (Signed)
SATURATION QUALIFICATIONS: (This note is used to comply with regulatory documentation for home oxygen)  Patient Saturations on Room Air at Rest = 92%  Patient Saturations on Room Air while Ambulating = 88*%  Patient Saturations on 2 Liters of oxygen while Ambulating = 93%  Please briefly explain why patient needs home oxygen:pt with desaturation on RA with activity Caleb [name], PT [phone]

## 2013-06-26 NOTE — Evaluation (Signed)
Seen and agree with SPT note Iyauna Sing [place] Beatris Belen, PT [phone]  

## 2013-06-26 NOTE — Evaluation (Signed)
Physical Therapy Evaluation/Discharge Note Patient Details Name: Caleb [name] MRN: [id number] DOB: 07-Jun-1956 Today's Date: 06/26/2013 Time: 2725-3664 PT Time Calculation (min): 19 min  PT Assessment / Plan / Recommendation History of Present Illness  57 y.o. Asian American male w/ PMHx of CAD, CHF, HTN, DM type II, presents to the ED w/ complaints of weakness and SOB. Patient was seen by me in his office last weak and the patient looked dehydrated. The patient is accompanied by his daughter who assisted w/ history. The patient claims to have recent fatigue, mostly w/ exertion as well as DOE and dry mouth as well as a mild dry cough. He also states that he has had dizziness and lightheadedness w/ standing recently. He denies any recent vomiting or diarrhea, but does claim to have somewhat decreased po intake recently as he has not been feeling well. He claims to urinate 2-3 x per hour, which is apparently normal for him. the patient is non-compliant w/ his insulin regimen and frequently has very high sugars, most likely contributing to his possible polyuria. The patient also complains of PND and 2-3 pillow orthopnea, not a new complaint for him  Clinical Impression  Patient reports being independent and no use of supplemental O2 prior to admission. Reports mild SOB after ambulating independently for approximately 100' on RA. Sats recovered with ambulation on 2L. Patient encouraged to employ energy conservation techniques and pursed lip breathing, walking with the nursing staff acutely. No recommendation for PT services acutely or following d/c necessary, PT signing off.  PT Assessment  Patent does not need any further PT services    Follow Up Recommendations  No PT follow up    Does the patient have the potential to tolerate intense rehabilitation      Barriers to Discharge        Equipment Recommendations  None recommended by PT    Recommendations for Other Services     Frequency       Precautions / Restrictions Precautions Precautions: None   Pertinent Vitals/Pain SpO2 91% on RA at rest, 88% on RA with activity; 94% on 2L with activity and 96% on 2L at rest; HR 67 at rest and 70-115 with ambulation; no report of pain;      Mobility  Bed Mobility Bed Mobility: Supine to Sit Supine to Sit: 6: Modified independent (Device/Increase time);HOB flat Transfers Transfers: Sit to Stand;Stand to Sit Sit to Stand: 6: Modified independent (Device/Increase time);From bed Stand to Sit: 6: Modified independent (Device/Increase time);To chair/3-in-1 Ambulation/Gait Ambulation/Gait Assistance: 7: Independent Ambulation Distance (Feet): 200 Feet Assistive device: None Gait Pattern: Within Functional Limits Gait velocity: WFL Stairs: No    Exercises     PT Diagnosis:    PT Problem List:   PT Treatment Interventions:       PT Goals(Current goals can be found in the care plan section) Acute Rehab PT Goals PT Goal Formulation: No goals set, d/c therapy  Visit Information  Last PT Received On: 06/26/13 Assistance Needed: +1 History of Present Illness: 57 y.o. Asian American male w/ PMHx of CAD, CHF, HTN, DM type II, presents to the ED w/ complaints of weakness and SOB. Patient was seen by me in his office last weak and the patient looked dehydrated. The patient is accompanied by his daughter who assisted w/ history. The patient claims to have recent fatigue, mostly w/ exertion as well as DOE and dry mouth as well as a mild dry cough. He also states that he has had  dizziness and lightheadedness w/ standing recently. He denies any recent vomiting or diarrhea, but does claim to have somewhat decreased po intake recently as he has not been feeling well. He claims to urinate 2-3 x per hour, which is apparently normal for him. the patient is non-compliant w/ his insulin regimen and frequently has very high sugars, most likely contributing to his possible polyuria. The patient also  complains of PND and 2-3 pillow orthopnea, not a new complaint for him       Prior Functioning  Home Living Family/patient expects to be discharged to:: Private residence Living Arrangements: Spouse/significant other Available Help at Discharge: Family;Available 24 hours/day Type of Home: House Home Access: Level entry Home Layout: Two level;Able to live on main level with bedroom/bathroom Home Equipment: None Prior Function Level of Independence: Independent Communication Communication: No difficulties    Cognition  Cognition Arousal/Alertness: Awake/alert Behavior During Therapy: WFL for tasks assessed/performed Overall Cognitive Status: Within Functional Limits for tasks assessed    Extremity/Trunk Assessment Upper Extremity Assessment Upper Extremity Assessment: Overall WFL for tasks assessed Lower Extremity Assessment Lower Extremity Assessment: Overall WFL for tasks assessed Cervical / Trunk Assessment Cervical / Trunk Assessment: Normal   Balance    End of Session PT - End of Session Equipment Utilized During Treatment: Oxygen Activity Tolerance: Patient tolerated treatment well Patient left: in chair;with call bell/phone within reach;with family/visitor present  GP     Willette Pa, SPT 06/26/2013, 8:42 AM

## 2013-06-26 NOTE — Progress Notes (Signed)
Subjective:  Feeling 50 % better. Good diuresis. Afebrile.  Objective:  Vital Signs in the last 24 hours: Temp:  [97.2 F (36.2 C)-98.8 F (37.1 C)] 98.6 F (37 C) (12/09 0557) Pulse Rate:  [76-102] 78 (12/09 0830) Cardiac Rhythm:  [-] Normal sinus rhythm (12/09 0720) Resp:  [18-24] 18 (12/09 0557) BP: (127-184)/(58-94) 128/58 mmHg (12/09 0557) SpO2:  [89 %-99 %] 92 % (12/09 0830) Weight:  [105.3 kg (232 lb 2.3 oz)-109.2 kg (240 lb 11.9 oz)] 105.3 kg (232 lb 2.3 oz) (12/09 0557)  Physical Exam: BP Readings from Last 1 Encounters:  06/26/13 128/58     Wt Readings from Last 1 Encounters:  06/26/13 105.3 kg (232 lb 2.3 oz)    Weight change:   HEENT: [place]/AT, Eyes-Brown, PERL, EOMI, Conjunctiva-Pink, Sclera-Non-icteric Neck: No JVD, No bruit, Trachea midline. Lungs:  Clearing, Bilateral. Cardiac:  Regular rhythm, normal S1 and S2, no S3. II/VI systolic murmur. Abdomen:  Soft, distended, non-tender and  tympanic on percussion. Extremities:  1 + edema present, R>L. No cyanosis. No clubbing. CNS: AxOx3, Cranial nerves grossly intact, moves all 4 extremities. Right handed. Skin: Warm and dry.    Intake/Output from previous day: 12/08 0701 - 12/09 0700 In: 843 [P.O.:840; I.V.:3] Out: 3100 [Urine:3100]    Lab Results: BMET    Component Value Date/Time   NA 140 06/26/2013 0240   K 3.6 06/26/2013 0240   CL 102 06/26/2013 0240   CO2 26 06/26/2013 0240   GLUCOSE 201* 06/26/2013 0240   BUN 12 06/26/2013 0240   CREATININE 1.37* 06/26/2013 0240   CALCIUM 8.2* 06/26/2013 0240   GFRNONAA 55* 06/26/2013 0240   GFRAA 64* 06/26/2013 0240   CBC    Component Value Date/Time   WBC 9.7 06/25/2013 1010   RBC 4.01* 06/25/2013 1010   HGB 13.2 06/25/2013 1010   HCT 38.5* 06/25/2013 1010   PLT 311 06/25/2013 1010   MCV 96.0 06/25/2013 1010   MCH 32.9 06/25/2013 1010   MCHC 34.3 06/25/2013 1010   RDW 15.8* 06/25/2013 1010   LYMPHSABS 1.5 06/25/2013 1010   MONOABS 0.5 06/25/2013 1010   EOSABS 0.3  06/25/2013 1010   BASOSABS 0.0 06/25/2013 1010   CARDIAC ENZYMES Lab Results  Component Value Date   TROPONINI <0.30 06/26/2013    Scheduled Meds: . albumin human  12.5 g Intravenous Once  . amLODipine  2.5 mg Oral Daily  . benazepril  40 mg Oral Daily  . carvedilol  25 mg Oral BID WC  . digoxin  0.25 mg Oral Daily  . furosemide  40 mg Intravenous BID  . heparin  5,000 Units Subcutaneous Q8H  . insulin aspart  0-15 Units Subcutaneous TID WC  . insulin glargine  10 Units Subcutaneous QHS  . pneumococcal 23 valent vaccine  0.5 mL Intramuscular Tomorrow-1000  . potassium chloride  20 mEq Oral TID  . sodium chloride  3 mL Intravenous Q12H  . spironolactone  25 mg Oral Daily   Continuous Infusions:  PRN Meds:.sodium chloride, acetaminophen, guaiFENesin, ondansetron (ZOFRAN) IV, sodium chloride  Assessment/Plan: Acute on chronic left heart systolic failure  DM, II  Dilated cardiomyopathy  CAD  S/P stent  Mitral valve regurgitation Tricuspid valve regurgitation Acute renal insufficiency  Increase Lantus insulin dose. Increase activity. Increase Potassium supplement Continue diuresis. Check uric acid level. Increase fluid restriction Offer right and left heart catheterization   LOS: 1 day    Orpah Cobb  MD  06/26/2013, 8:38 AM

## 2013-06-27 ENCOUNTER — Inpatient Hospital Stay (HOSPITAL_COMMUNITY): Payer: BC Managed Care – PPO

## 2013-06-27 LAB — COMPREHENSIVE METABOLIC PANEL
ALT: 11 U/L (ref 0–53)
Albumin: 3.3 g/dL — ABNORMAL LOW (ref 3.5–5.2)
BUN: 15 mg/dL (ref 6–23)
Calcium: 8.5 mg/dL (ref 8.4–10.5)
Creatinine, Ser: 1.58 mg/dL — ABNORMAL HIGH (ref 0.50–1.35)
GFR calc Af Amer: 54 mL/min — ABNORMAL LOW (ref >90)
Glucose, Bld: 125 mg/dL — ABNORMAL HIGH (ref 70–99)
Potassium: 3.9 mEq/L (ref 3.5–5.1)
Sodium: 139 mEq/L (ref 135–145)
Total Protein: 6.9 g/dL (ref 6.0–8.3)

## 2013-06-27 LAB — CBC
HCT: 34.1 % — ABNORMAL LOW (ref 39.0–52.0)
Hemoglobin: 11.3 g/dL — ABNORMAL LOW (ref 13.0–17.0)
MCHC: 33.1 g/dL (ref 30.0–36.0)
MCV: 98 fL (ref 78.0–100.0)
RBC: 3.48 MIL/uL — ABNORMAL LOW (ref 4.22–5.81)
WBC: 7.8 10*3/uL (ref 4.0–10.5)

## 2013-06-27 LAB — GLUCOSE, CAPILLARY: Glucose-Capillary: 113 mg/dL — ABNORMAL HIGH (ref 70–99)

## 2013-06-27 MED ORDER — DIGOXIN 125 MCG PO TABS: 0.1250 mg | ORAL_TABLET | Freq: Every day | ORAL | Status: DC

## 2013-06-27 MED ORDER — SPIRONOLACTONE 25 MG PO TABS: 25.0000 mg | ORAL_TABLET | Freq: Every day | ORAL | Status: DC

## 2013-06-27 MED ORDER — FUROSEMIDE 40 MG PO TABS: 40.0000 mg | ORAL_TABLET | Freq: Two times a day (BID) | ORAL | Status: DC

## 2013-06-27 MED ORDER — CARVEDILOL 25 MG PO TABS: 25.0000 mg | ORAL_TABLET | Freq: Two times a day (BID) | ORAL | Status: DC

## 2013-06-27 MED ORDER — GUAIFENESIN 100 MG/5ML PO SOLN: 5.0000 mL | ORAL | Status: DC | PRN

## 2013-06-27 MED ORDER — INSULIN GLARGINE 100 UNIT/ML ~~LOC~~ SOLN: 20.0000 [IU] | Freq: Every day | SUBCUTANEOUS | Status: DC

## 2013-06-27 MED ORDER — AZITHROMYCIN 250 MG PO TABS: ORAL_TABLET | ORAL | Status: DC

## 2013-06-27 NOTE — Discharge Summary (Signed)
Physician Discharge Summary  Patient ID: Caleb [name] MRN: [id number] DOB/AGE: 59-10-1955 59 y.o.  Admit date: 06/25/2013 Discharge date: 06/27/2013  Admission Diagnoses: Acute on chronic left heart systolic failure  DM, II  Dilated cardiomyopathy  CAD  S/P stent  Mitral valve regurgitation  Tricuspid valve regurgitation    Discharge Diagnoses:  Principle Problem: * Acute on chronic left systolic heart failure * Possible left lung pneumonia Acute pericardial effusion Chronic kidney disease, II DM, II  Dilated cardiomyopathy  CAD  S/P stent  Mitral valve regurgitation  Tricuspid valve regurgitation Ascites Pleural effusion   Discharged Condition: fair  Hospital Course: 57 y.o. Asian American male w/ PMHx of CAD, CHF, HTN, DM type II, presented to the ED w/ complaints of weakness and SOB. Chest x-ray and BNP level were consistent with acute systolic heart failure. Patient has known dilated and possibly ischemic cardiomyopathy. He refused right and left heart catheterization. His Lotensin (Ace Inhibitor) was held for worsening renal function. He had fair diuresis with IV lasix and his leg edema had resolved and he was not short of breath at rest. He was treated with oral antibiotic for possible left lung pneumonia v/s atelectasis. His elevated sugar levels improved with Lantus insulin use. Patient was discharged home in fair condition with follow up by me in 5 days.  Consults: None  Significant Diagnostic Studies: labs: Near normal CBC and CMET except elevated sugar and mildly elevated Bilirubin which normalized post diuresis. BUN was normal but creatinine increased to 1.58 post diuresis.  Chest x-ray showed improvement in mild interstitial edema with possible left lung pneumonia or atelectasis.  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 Low voltage and inferior wall MI, probably old.  Echocardiogram - Left ventricle: The cavity size was mildly dilated. There  was mild concentric hypertrophy. Systolic function was moderately to severely reduced. The estimated ejection fraction was in the range of 30% to 35%. There is severe hypokinesis of the entire anterior myocardium. - Mitral valve: Mild regurgitation. - Left atrium: The atrium was mildly dilated. - Pulmonary arteries: Systolic pressure was mildly increased. PA peak pressure: 35mm Hg (S). - Pericardium, extracardiac: A small to moderate, free-flowing pericardial effusion was identified. There was no evidence of hemodynamic compromise. There was a left pleural effusion. Ascites was noted.   Treatments: cardiac meds: benazepril (Lotensin), carvedilol, amlodipine, digoxin and furosemide  Discharge Exam: Blood pressure 125/64, pulse 88, temperature 98.2 F (36.8 C), temperature source Oral, resp. rate 20, height 5\' 11"  (1.803 m), weight 104.826 kg (231 lb 1.6 oz), SpO2 98.00%.  HEENT: [place]/AT, Eyes-Brown, PERL, EOMI, Conjunctiva-Pink, Sclera-Non-icteric  Neck: No JVD, No bruit, Trachea midline.  Lungs: Clearing, Bilateral.  Cardiac: Regular rhythm, normal S1 and S2, no S3. II/VI systolic murmur.  Abdomen: Soft, distended, non-tender and tympanic on percussion.  Extremities: Trace edema present. No cyanosis. No clubbing.  CNS: AxOx3, Cranial nerves grossly intact, moves all 4 extremities. Right handed.  Skin: Warm and dry.   Disposition: 01, Home, Self care.     Medication List    STOP taking these medications       benazepril 40 MG tablet  Commonly known as:  LOTENSIN      TAKE these medications       azithromycin 250 MG tablet  Commonly known as:  ZITHROMAX  One daily.     carvedilol 25 MG tablet  Commonly known as:  COREG  Take 1 tablet (25 mg total) by mouth 2 (two) times daily with  a meal.     digoxin 0.125 MG tablet  Commonly known as:  LANOXIN  Take 1 tablet (0.125 mg total) by mouth daily.     furosemide 40 MG tablet  Commonly known as:  LASIX  Take 1 tablet (40 mg  total) by mouth 2 (two) times daily.     guaiFENesin 100 MG/5ML Soln  Commonly known as:  ROBITUSSIN  Take 5 mLs (100 mg total) by mouth every 4 (four) hours as needed for cough or to loosen phlegm.     insulin glargine 100 UNIT/ML injection  Commonly known as:  LANTUS  Inject 0.2 mLs (20 Units total) into the skin at bedtime.     OVER THE COUNTER MEDICATION  Take 1 tablet by mouth daily. For reflux     spironolactone 25 MG tablet  Commonly known as:  ALDACTONE  Take 1 tablet (25 mg total) by mouth daily.           Follow-up Information   Follow up with [facility] S, MD. Schedule an appointment as soon as possible for a visit in 5 days.   Specialty:  Cardiology   Contact information:   [address] [place] [address] [phone]       Signed: Ricki Rodriguez 06/27/2013, 9:12 AM

## 2016-10-08 ENCOUNTER — Inpatient Hospital Stay (HOSPITAL_COMMUNITY): Payer: BLUE CROSS/BLUE SHIELD

## 2016-10-08 ENCOUNTER — Inpatient Hospital Stay (HOSPITAL_COMMUNITY)
Admission: AD | Admit: 2016-10-08 | Discharge: 2016-10-12 | DRG: 291 | Disposition: A | Discharge status: Home / Self Care | Payer: BLUE CROSS/BLUE SHIELD | Source: Ambulatory Visit | Attending: Cardiovascular Disease | Admitting: Cardiovascular Disease

## 2016-10-08 ENCOUNTER — Encounter (HOSPITAL_COMMUNITY): Payer: Self-pay | Admitting: *Deleted

## 2016-10-08 DIAGNOSIS — R0602 Shortness of breath: Secondary | ICD-10-CM | POA: Diagnosis present

## 2016-10-08 DIAGNOSIS — N183 Chronic kidney disease, stage 3 (moderate): Secondary | ICD-10-CM | POA: Diagnosis present

## 2016-10-08 DIAGNOSIS — I5023 Acute on chronic systolic (congestive) heart failure: Secondary | ICD-10-CM | POA: Diagnosis present

## 2016-10-08 DIAGNOSIS — Z794 Long term (current) use of insulin: Secondary | ICD-10-CM

## 2016-10-08 DIAGNOSIS — N179 Acute kidney failure, unspecified: Secondary | ICD-10-CM | POA: Diagnosis present

## 2016-10-08 DIAGNOSIS — Z955 Presence of coronary angioplasty implant and graft: Secondary | ICD-10-CM

## 2016-10-08 DIAGNOSIS — I130 Hypertensive heart and chronic kidney disease with heart failure and stage 1 through stage 4 chronic kidney disease, or unspecified chronic kidney disease: Principal | ICD-10-CM | POA: Diagnosis present

## 2016-10-08 DIAGNOSIS — Z6831 Body mass index (BMI) 31.0-31.9, adult: Secondary | ICD-10-CM

## 2016-10-08 DIAGNOSIS — E11649 Type 2 diabetes mellitus with hypoglycemia without coma: Secondary | ICD-10-CM | POA: Diagnosis not present

## 2016-10-08 DIAGNOSIS — E1122 Type 2 diabetes mellitus with diabetic chronic kidney disease: Secondary | ICD-10-CM | POA: Diagnosis present

## 2016-10-08 DIAGNOSIS — E669 Obesity, unspecified: Secondary | ICD-10-CM | POA: Diagnosis present

## 2016-10-08 DIAGNOSIS — E1165 Type 2 diabetes mellitus with hyperglycemia: Secondary | ICD-10-CM | POA: Diagnosis present

## 2016-10-08 DIAGNOSIS — D638 Anemia in other chronic diseases classified elsewhere: Secondary | ICD-10-CM | POA: Diagnosis present

## 2016-10-08 DIAGNOSIS — Z79899 Other long term (current) drug therapy: Secondary | ICD-10-CM

## 2016-10-08 DIAGNOSIS — I509 Heart failure, unspecified: Secondary | ICD-10-CM

## 2016-10-08 DIAGNOSIS — I2510 Atherosclerotic heart disease of native coronary artery without angina pectoris: Secondary | ICD-10-CM | POA: Diagnosis present

## 2016-10-08 DIAGNOSIS — I313 Pericardial effusion (noninflammatory): Secondary | ICD-10-CM | POA: Diagnosis present

## 2016-10-08 DIAGNOSIS — R945 Abnormal results of liver function studies: Secondary | ICD-10-CM

## 2016-10-08 DIAGNOSIS — R7989 Other specified abnormal findings of blood chemistry: Secondary | ICD-10-CM

## 2016-10-08 DIAGNOSIS — IMO0002 Reserved for concepts with insufficient information to code with codable children: Secondary | ICD-10-CM | POA: Diagnosis present

## 2016-10-08 LAB — CBC WITH DIFFERENTIAL/PLATELET
BASOS ABS: 0 10*3/uL (ref 0.0–0.1)
Basophils Relative: 1 %
EOS PCT: 4 %
Eosinophils Absolute: 0.2 10*3/uL (ref 0.0–0.7)
HCT: 31.5 % — ABNORMAL LOW (ref 39.0–52.0)
Hemoglobin: 10.5 g/dL — ABNORMAL LOW (ref 13.0–17.0)
LYMPHS PCT: 24 %
Lymphs Abs: 1.2 10*3/uL (ref 0.7–4.0)
MCH: 31.4 pg (ref 26.0–34.0)
MCHC: 33.3 g/dL (ref 30.0–36.0)
MCV: 94.3 fL (ref 78.0–100.0)
Monocytes Absolute: 0.5 10*3/uL (ref 0.1–1.0)
Monocytes Relative: 10 %
Neutro Abs: 2.9 10*3/uL (ref 1.7–7.7)
Neutrophils Relative %: 61 %
PLATELETS: 180 10*3/uL (ref 150–400)
RBC: 3.34 MIL/uL — AB (ref 4.22–5.81)
RDW: 15.9 % — ABNORMAL HIGH (ref 11.5–15.5)
WBC: 4.8 10*3/uL (ref 4.0–10.5)

## 2016-10-08 LAB — COMPREHENSIVE METABOLIC PANEL
ALT: 15 U/L — AB (ref 17–63)
AST: 24 U/L (ref 15–41)
Albumin: 2.8 g/dL — ABNORMAL LOW (ref 3.5–5.0)
Alkaline Phosphatase: 194 U/L — ABNORMAL HIGH (ref 38–126)
Anion gap: 7 (ref 5–15)
BUN: 23 mg/dL — ABNORMAL HIGH (ref 6–20)
CHLORIDE: 108 mmol/L (ref 101–111)
CO2: 27 mmol/L (ref 22–32)
CREATININE: 1.67 mg/dL — AB (ref 0.61–1.24)
Calcium: 8.4 mg/dL — ABNORMAL LOW (ref 8.9–10.3)
GFR calc Af Amer: 49 mL/min — ABNORMAL LOW (ref >60)
GFR calc non Af Amer: 42 mL/min — ABNORMAL LOW (ref >60)
GLUCOSE: 138 mg/dL — AB (ref 65–99)
Potassium: 4.3 mmol/L (ref 3.5–5.1)
SODIUM: 142 mmol/L (ref 135–145)
Total Bilirubin: 2.2 mg/dL — ABNORMAL HIGH (ref 0.3–1.2)
Total Protein: 6.3 g/dL — ABNORMAL LOW (ref 6.5–8.1)

## 2016-10-08 LAB — TROPONIN I: Troponin I: <0.03 ng/mL (ref <0.03)

## 2016-10-08 LAB — GLUCOSE, CAPILLARY: GLUCOSE-CAPILLARY: 133 mg/dL — AB (ref 65–99)

## 2016-10-08 LAB — ECHOCARDIOGRAM COMPLETE

## 2016-10-08 LAB — BRAIN NATRIURETIC PEPTIDE: B Natriuretic Peptide: 2679.5 pg/mL — ABNORMAL HIGH (ref 0.0–100.0)

## 2016-10-08 MED ORDER — ISONIAZID 300 MG PO TABS: 150.0000 mg | ORAL_TABLET | Freq: Every day | ORAL | Status: DC

## 2016-10-08 MED ORDER — FUROSEMIDE 10 MG/ML IJ SOLN
40.0000 mg | Freq: Two times a day (BID) | INTRAMUSCULAR | Status: DC
  Administered 2016-10-08 – 2016-10-10 (×4): 40 mg via INTRAVENOUS
  Filled 2016-10-08 (×4): qty 4

## 2016-10-08 MED ORDER — ONDANSETRON HCL 4 MG/2ML IJ SOLN: 4.0000 mg | Freq: Four times a day (QID) | INTRAMUSCULAR | Status: DC | PRN

## 2016-10-08 MED ORDER — INFLUENZA VAC SPLIT QUAD 0.5 ML IM SUSY
0.5000 mL | PREFILLED_SYRINGE | INTRAMUSCULAR | Status: AC
  Administered 2016-10-09: 0.5 mL via INTRAMUSCULAR
  Filled 2016-10-08: qty 0.5

## 2016-10-08 MED ORDER — HEPARIN SODIUM (PORCINE) 5000 UNIT/ML IJ SOLN
5000.0000 [IU] | Freq: Three times a day (TID) | INTRAMUSCULAR | Status: DC
  Administered 2016-10-08 – 2016-10-12 (×10): 5000 [IU] via SUBCUTANEOUS
  Filled 2016-10-08 (×11): qty 1

## 2016-10-08 MED ORDER — ASPIRIN EC 81 MG PO TBEC
81.0000 mg | DELAYED_RELEASE_TABLET | Freq: Every day | ORAL | Status: DC
  Administered 2016-10-08 – 2016-10-12 (×5): 81 mg via ORAL
  Filled 2016-10-08 (×6): qty 1

## 2016-10-08 MED ORDER — ISOSORB DINITRATE-HYDRALAZINE 20-37.5 MG PO TABS
1.0000 | ORAL_TABLET | Freq: Two times a day (BID) | ORAL | Status: DC
  Administered 2016-10-08 – 2016-10-12 (×8): 1 via ORAL
  Filled 2016-10-08 (×8): qty 1

## 2016-10-08 MED ORDER — RIFAMPIN 300 MG PO CAPS
600.0000 mg | ORAL_CAPSULE | Freq: Every day | ORAL | Status: DC
  Administered 2016-10-09 – 2016-10-12 (×4): 600 mg via ORAL
  Filled 2016-10-08 (×4): qty 2

## 2016-10-08 MED ORDER — INSULIN ASPART 100 UNIT/ML ~~LOC~~ SOLN
0.0000 [IU] | Freq: Three times a day (TID) | SUBCUTANEOUS | Status: DC
  Administered 2016-10-10: 2 [IU] via SUBCUTANEOUS

## 2016-10-08 MED ORDER — CARVEDILOL 12.5 MG PO TABS
12.5000 mg | ORAL_TABLET | Freq: Two times a day (BID) | ORAL | Status: DC
  Administered 2016-10-08 – 2016-10-12 (×8): 12.5 mg via ORAL
  Filled 2016-10-08 (×8): qty 1

## 2016-10-08 MED ORDER — PERFLUTREN LIPID MICROSPHERE
INTRAVENOUS | Status: AC
  Administered 2016-10-08: 2 mL
  Filled 2016-10-08: qty 10

## 2016-10-08 MED ORDER — ACETAMINOPHEN 325 MG PO TABS: 650.0000 mg | ORAL_TABLET | ORAL | Status: DC | PRN

## 2016-10-08 MED ORDER — SODIUM CHLORIDE 0.9% FLUSH
3.0000 mL | Freq: Two times a day (BID) | INTRAVENOUS | Status: DC
  Administered 2016-10-08 – 2016-10-12 (×8): 3 mL via INTRAVENOUS

## 2016-10-08 MED ORDER — ALLOPURINOL 100 MG PO TABS
100.0000 mg | ORAL_TABLET | Freq: Every day | ORAL | Status: DC
  Administered 2016-10-08 – 2016-10-12 (×5): 100 mg via ORAL
  Filled 2016-10-08 (×5): qty 1

## 2016-10-08 MED ORDER — INSULIN ASPART PROT & ASPART (70-30 MIX) 100 UNIT/ML ~~LOC~~ SUSP
16.0000 [IU] | Freq: Every day | SUBCUTANEOUS | Status: DC
Start: 2016-10-09 — End: 2016-10-11
  Administered 2016-10-09 – 2016-10-11 (×3): 16 [IU] via SUBCUTANEOUS
  Filled 2016-10-08: qty 10

## 2016-10-08 MED ORDER — ROSUVASTATIN CALCIUM 10 MG PO TABS
10.0000 mg | ORAL_TABLET | Freq: Every day | ORAL | Status: DC
  Administered 2016-10-08 – 2016-10-11 (×4): 10 mg via ORAL
  Filled 2016-10-08 (×4): qty 1

## 2016-10-08 MED ORDER — COLCHICINE 0.6 MG PO TABS
0.6000 mg | ORAL_TABLET | Freq: Every day | ORAL | Status: DC
  Administered 2016-10-08 – 2016-10-12 (×5): 0.6 mg via ORAL
  Filled 2016-10-08 (×5): qty 1

## 2016-10-08 MED ORDER — IRBESARTAN 300 MG PO TABS
150.0000 mg | ORAL_TABLET | Freq: Every day | ORAL | Status: DC
  Administered 2016-10-09 – 2016-10-12 (×4): 150 mg via ORAL
  Filled 2016-10-08 (×5): qty 1

## 2016-10-08 MED ORDER — SODIUM CHLORIDE 0.9% FLUSH: 3.0000 mL | INTRAVENOUS | Status: DC | PRN

## 2016-10-08 MED ORDER — CALCIUM ACETATE (PHOS BINDER) 667 MG PO CAPS
667.0000 mg | ORAL_CAPSULE | Freq: Two times a day (BID) | ORAL | Status: DC
  Administered 2016-10-08 – 2016-10-12 (×8): 667 mg via ORAL
  Filled 2016-10-08 (×8): qty 1

## 2016-10-08 MED ORDER — PERFLUTREN LIPID MICROSPHERE
1.0000 mL | INTRAVENOUS | Status: AC | PRN
  Filled 2016-10-08: qty 10

## 2016-10-08 MED ORDER — INSULIN ASPART PROT & ASPART (70-30 MIX) 100 UNIT/ML ~~LOC~~ SUSP: 16.0000 [IU] | Freq: Two times a day (BID) | SUBCUTANEOUS | Status: DC

## 2016-10-08 MED ORDER — ISONIAZID 300 MG PO TABS
300.0000 mg | ORAL_TABLET | Freq: Every day | ORAL | Status: DC
  Administered 2016-10-09 – 2016-10-12 (×4): 300 mg via ORAL
  Filled 2016-10-08 (×4): qty 1

## 2016-10-08 MED ORDER — METFORMIN HCL 500 MG PO TABS
500.0000 mg | ORAL_TABLET | Freq: Two times a day (BID) | ORAL | Status: DC
  Administered 2016-10-08 – 2016-10-10 (×5): 500 mg via ORAL
  Filled 2016-10-08 (×6): qty 1

## 2016-10-08 MED ORDER — SODIUM CHLORIDE 0.9 % IV SOLN: 250.0000 mL | INTRAVENOUS | Status: DC | PRN

## 2016-10-08 NOTE — Progress Notes (Signed)
Notified by Pharmacy patient on Rifampin prescribed while in [place] per patient through interpretative services.  [facility] and made aware.  Patient is currently asymptomatic and no precautions.  If clinical picture changes, please assess for precautions.

## 2016-10-08 NOTE — Progress Notes (Signed)
Notified by pharmacy that pt is currently being treated for TB.  Called IP to see how to proceed.  IP told RN to ask the following questions:  When did you come from [place]?  Pt states February 9  Have you experience any of the following symptoms: night sweats, blood sputum, weight loss, fevers at home? Pt denies any of the following symptoms  What doctor is following you and started TB medications? Pt states that the heart specialists in [place] put him on the medication because of "fluid on xray" he states that he has been on the medication for 6mnth.  Pt also states that the MD started meds as a precaution when he became ill in [place].

## 2016-10-08 NOTE — H&P (Signed)
Referring Physician:  [name] is an 61 y.o. male.                       Chief Complaint: Shortness of breath and leg edema  HPI: 61 year old Asian male with PMH of CHF, CAD, DM, II and hypertension has 1 month history of shortness of breath with leg edema and 19 pounds weight gain in 2 months. No fever, cough or chest pain.  Past Medical History:  Diagnosis Date  . CHF (congestive heart failure)   . Coronary artery disease   . Diabetes mellitus without complication   . Hypertension       Past Surgical History:  Procedure Laterality Date  . NO PAST SURGERIES      No family history on file. Social History:  reports that he has never smoked. He has quit using smokeless tobacco. He reports that he drinks alcohol. He reports that he does not use drugs.  Allergies:  Allergies  Allergen Reactions  . Sulfa Antibiotics     Medications Prior to Admission  Medication Sig Dispense Refill  . azithromycin (ZITHROMAX) 250 MG tablet One daily. 6 each 0  . carvedilol (COREG) 25 MG tablet Take 1 tablet (25 mg total) by mouth 2 (two) times daily with a meal. 60 tablet 1  . digoxin (LANOXIN) 0.125 MG tablet Take 1 tablet (0.125 mg total) by mouth daily. 30 tablet 1  . furosemide (LASIX) 40 MG tablet Take 1 tablet (40 mg total) by mouth 2 (two) times daily. 60 tablet 1  . guaiFENesin (ROBITUSSIN) 100 MG/5ML SOLN Take 5 mLs (100 mg total) by mouth every 4 (four) hours as needed for cough or to loosen phlegm. 240 mL 0  . insulin glargine (LANTUS) 100 UNIT/ML injection Inject 0.2 mLs (20 Units total) into the skin at bedtime. 10 mL 12  . OVER THE COUNTER MEDICATION Take 1 tablet by mouth daily. For reflux    . spironolactone (ALDACTONE) 25 MG tablet Take 1 tablet (25 mg total) by mouth daily. 30 tablet 1    No results found for this or any previous visit (from the past 48 hour([name])). No results found.  Review Of Systems Constitutional: No fever, chills , weight loss or gain. Positive for  fatigue. Eyes: No vision change, Wears glasses. No discharge or pain.. Ears: No hearing loss, No tinnitus. Respiratory: No asthma, COPD, pneumonias Positive for shortness of breath. No hemoptysis. Cardiovascular: Occasional chest pain, palpitation and leg edema. Gastrointestinal: No nausea, vomiting or diarrhea or constipation. No GI bleed. No hepatitis. Genitourinary: No dysuria, hematuria or kidney stone. No incontinance. Neurological: No headache, stroke or seizures. Positive dizziness. Psychiatry: No psych facility admission for anxiety, depression or suicide. No detox. Skin: No rash. Musculoskeletal: Positive joint pain. No fibromyalgia. No neck pain or back pain. Lymphadenopathy: No lymphadenopathy Hematology: No anemia or easy bruising.   Blood pressure 134/70, pulse 72, temperature 97.6 F (36.4 C), temperature source Oral, SpO2 95 %. There is no height or weight on file to calculate BMI. General appearance: alert, cooperative, appears stated age and mild distress Head: Normocephalic, atraumatic. Eyes: Brown eyes, pink conjunctivae/corneas clear. PERRL, EOM'[name] intact.  Neck: No adenopathy, no carotid bruit, + JVD, supple, symmetrical, trachea midline and thyroid not enlarged. Resp: Basal crackles to auscultation bilaterally Cardio: regular rate and rhythm, S1, S2 normal, II/VI systolic murmur, no click, rub or gallop GI: soft, non-tender; bowel sounds normal; no masses,  no organomegaly Extremities: 2 +  lower leg edema bilateral. no cyanosis. Skin: Warm and dry. No rashes or lesions Neurologic: Alert and oriented X 3, normal strength and tone. Normal coordination and slow gait.  Assessment/Plan Acute on chronic systolic left heart failure DM, II Hypertension Obesity CAD [name]/P stent  Admit/IV lasix/Echocardiogram for LV function.   Caleb [name],Caleb Boggus S, MD  10/08/2016, 2:16 PM

## 2016-10-08 NOTE — Progress Notes (Signed)
Spoke with Caleb [name], Director of IP who tells me that after speaking to the health department pt does not need to be placed on airborne precautions. She said there is no need for staff or patient to wear precaution unless he has any symptoms.  Pt has no symptoms.

## 2016-10-08 NOTE — Progress Notes (Signed)
New pt admission as direct admit from Dr. Algie CofferKad[place]. Pt brought to the floor in stable condition. Vitals taken. Initial Assessment done. All immediate pertinent needs to patient addressed. Patient Guide given to patient. Important safety instructions relating to hospitalization reviewed with patient. Patient verbalized understanding. Will continue to monitor pt.  Wynona NeatJessica Aiyana Stegmann RN

## 2016-10-09 LAB — DIGOXIN LEVEL: Digoxin Level: <0.2 ng/mL — ABNORMAL LOW (ref 0.8–2.0)

## 2016-10-09 LAB — GLUCOSE, CAPILLARY
GLUCOSE-CAPILLARY: 115 mg/dL — AB (ref 65–99)
Glucose-Capillary: 84 mg/dL (ref 65–99)
Glucose-Capillary: 90 mg/dL (ref 65–99)

## 2016-10-09 LAB — BASIC METABOLIC PANEL
ANION GAP: 7 (ref 5–15)
BUN: 23 mg/dL — ABNORMAL HIGH (ref 6–20)
CO2: 28 mmol/L (ref 22–32)
Calcium: 8.5 mg/dL — ABNORMAL LOW (ref 8.9–10.3)
Chloride: 106 mmol/L (ref 101–111)
Creatinine, Ser: 1.66 mg/dL — ABNORMAL HIGH (ref 0.61–1.24)
GFR, EST AFRICAN AMERICAN: 49 mL/min — AB (ref >60)
GFR, EST NON AFRICAN AMERICAN: 43 mL/min — AB (ref >60)
Glucose, Bld: 139 mg/dL — ABNORMAL HIGH (ref 65–99)
POTASSIUM: 4.2 mmol/L (ref 3.5–5.1)
Sodium: 141 mmol/L (ref 135–145)

## 2016-10-09 LAB — HIV ANTIBODY (ROUTINE TESTING W REFLEX): HIV SCREEN 4TH GENERATION: NONREACTIVE

## 2016-10-09 LAB — HEMOGLOBIN A1C
Hgb A1c MFr Bld: 6.3 % — ABNORMAL HIGH (ref 4.8–5.6)
MEAN PLASMA GLUCOSE: 134 mg/dL

## 2016-10-09 LAB — TROPONIN I: Troponin I: <0.03 ng/mL (ref <0.03)

## 2016-10-09 MED ORDER — SPIRONOLACTONE 25 MG PO TABS
12.5000 mg | ORAL_TABLET | Freq: Two times a day (BID) | ORAL | Status: DC
  Administered 2016-10-09 – 2016-10-12 (×7): 12.5 mg via ORAL
  Filled 2016-10-09 (×7): qty 1

## 2016-10-09 NOTE — Progress Notes (Signed)
Pt denies pain, sitting up at edge of bed eith son at bedside. No needs at thus time. Eating breakfast from home

## 2016-10-09 NOTE — Progress Notes (Signed)
Ref: Waldon Sheerin S, MD   Subjective:  Leg edema persist. Decreasing shortness of breath. Moderate loculated posterior pericardial effusion.  Objective:  Vital Signs in the last 24 hours: Temp:  [97.5 F (36.4 C)-98.4 F (36.9 C)] 97.8 F (36.6 C) (03/24 1204) Pulse Rate:  [69-72] 69 (03/24 1204) Cardiac Rhythm: Normal sinus rhythm (03/24 0812) Resp:  [18-20] 20 (03/24 1204) BP: (125-148)/(66-84) 141/75 (03/24 1204) SpO2:  [95 %-99 %] 97 % (03/24 1204) Weight:  [104.5 kg (230 lb 6.4 oz)-105.5 kg (232 lb 9.6 oz)] 104.5 kg (230 lb 6.4 oz) (03/24 0441)  Physical Exam: BP Readings from Last 1 Encounters:  10/09/16 (!) 141/75    Wt Readings from Last 1 Encounters:  10/09/16 104.5 kg (230 lb 6.4 oz)    Weight change:  Body mass index is 32.13 kg/m. HEENT: [place]/AT, Eyes-Brown, PERL, EOMI, Conjunctiva-Pink, Sclera-Non-icteric Neck: No JVD, No bruit, Trachea midline. Lungs:  Clearing, Bilateral. Cardiac:  Regular rhythm, normal S1 and S2, no S3. II/VI systolic murmur. Abdomen:  Soft, non-tender. BS present. Extremities:  2+ edema present. No cyanosis. No clubbing. CNS: AxOx3, Cranial nerves grossly intact, moves all 4 extremities.  Skin: Warm and dry.   Intake/Output from previous day: 03/23 0701 - 03/24 0700 In: 720 [P.O.:720] Out: 1525 [Urine:1525]    Lab Results: BMET    Component Value Date/Time   NA 141 10/09/2016 0046   NA 142 10/08/2016 1410   NA 139 06/27/2013 0517   K 4.2 10/09/2016 0046   K 4.3 10/08/2016 1410   K 3.9 06/27/2013 0517   CL 106 10/09/2016 0046   CL 108 10/08/2016 1410   CL 101 06/27/2013 0517   CO2 28 10/09/2016 0046   CO2 27 10/08/2016 1410   CO2 27 06/27/2013 0517   GLUCOSE 139 (H) 10/09/2016 0046   GLUCOSE 138 (H) 10/08/2016 1410   GLUCOSE 125 (H) 06/27/2013 0517   BUN 23 (H) 10/09/2016 0046   BUN 23 (H) 10/08/2016 1410   BUN 15 06/27/2013 0517   CREATININE 1.66 (H) 10/09/2016 0046   CREATININE 1.67 (H) 10/08/2016 1410   CREATININE  1.58 (H) 06/27/2013 0517   CALCIUM 8.5 (L) 10/09/2016 0046   CALCIUM 8.4 (L) 10/08/2016 1410   CALCIUM 8.5 06/27/2013 0517   GFRNONAA 43 (L) 10/09/2016 0046   GFRNONAA 42 (L) 10/08/2016 1410   GFRNONAA 46 (L) 06/27/2013 0517   GFRAA 49 (L) 10/09/2016 0046   GFRAA 49 (L) 10/08/2016 1410   GFRAA 54 (L) 06/27/2013 0517   CBC    Component Value Date/Time   WBC 4.8 10/08/2016 1410   RBC 3.34 (L) 10/08/2016 1410   HGB 10.5 (L) 10/08/2016 1410   HCT 31.5 (L) 10/08/2016 1410   PLT 180 10/08/2016 1410   MCV 94.3 10/08/2016 1410   MCH 31.4 10/08/2016 1410   MCHC 33.3 10/08/2016 1410   RDW 15.9 (H) 10/08/2016 1410   LYMPHSABS 1.2 10/08/2016 1410   MONOABS 0.5 10/08/2016 1410   EOSABS 0.2 10/08/2016 1410   BASOSABS 0.0 10/08/2016 1410   HEPATIC Function Panel  Recent Labs  10/08/16 1410  PROT 6.3*   HEMOGLOBIN A1C No components found for: HGA1C,  MPG CARDIAC ENZYMES Lab Results  Component Value Date   TROPONINI <0.03 10/09/2016   TROPONINI <0.03 10/08/2016   TROPONINI <0.03 10/08/2016   BNP No results for input(s): PROBNP in the last 8760 hours. TSH No results for input(s): TSH in the last 8760 hours. CHOLESTEROL No results for input(s): CHOL in the  last 8760 hours.  Scheduled Meds: . allopurinol  100 mg Oral Daily  . aspirin EC  81 mg Oral Daily  . calcium acetate  667 mg Oral BID WC  . carvedilol  12.5 mg Oral BID WC  . colchicine  0.6 mg Oral Daily  . furosemide  40 mg Intravenous Q12H  . heparin  5,000 Units Subcutaneous Q8H  . insulin aspart  0-15 Units Subcutaneous TID WC  . insulin aspart protamine- aspart  16 Units Subcutaneous Q breakfast  . irbesartan  150 mg Oral Daily  . isoniazid  300 mg Oral Daily  . isosorbide-hydrALAZINE  1 tablet Oral BID  . metFORMIN  500 mg Oral BID WC  . rifampin  600 mg Oral Daily  . rosuvastatin  10 mg Oral q1800  . sodium chloride flush  3 mL Intravenous Q12H   Continuous Infusions: PRN Meds:.sodium chloride,  acetaminophen, ondansetron (ZOFRAN) IV, sodium chloride flush  Assessment/Plan: Acute on chronic systolic left heart failure DM, II Hypertension Obesity CAD S/P Stent  Continue medical treatment. Add aldactone.   LOS: 1 day    Orpah CobbAjay Luca Dyar  MD  10/09/2016, 12:43 PM    Conley RollsLe

## 2016-10-10 LAB — GLUCOSE, CAPILLARY
GLUCOSE-CAPILLARY: 134 mg/dL — AB (ref 65–99)
GLUCOSE-CAPILLARY: 92 mg/dL (ref 65–99)
Glucose-Capillary: 84 mg/dL (ref 65–99)
Glucose-Capillary: 95 mg/dL (ref 65–99)

## 2016-10-10 LAB — BASIC METABOLIC PANEL
ANION GAP: 7 (ref 5–15)
BUN: 23 mg/dL — ABNORMAL HIGH (ref 6–20)
CO2: 28 mmol/L (ref 22–32)
Calcium: 8.4 mg/dL — ABNORMAL LOW (ref 8.9–10.3)
Chloride: 106 mmol/L (ref 101–111)
Creatinine, Ser: 1.72 mg/dL — ABNORMAL HIGH (ref 0.61–1.24)
GFR calc Af Amer: 47 mL/min — ABNORMAL LOW (ref >60)
GFR calc non Af Amer: 41 mL/min — ABNORMAL LOW (ref >60)
GLUCOSE: 85 mg/dL (ref 65–99)
Potassium: 3.7 mmol/L (ref 3.5–5.1)
Sodium: 141 mmol/L (ref 135–145)

## 2016-10-10 MED ORDER — FUROSEMIDE 10 MG/ML IJ SOLN
40.0000 mg | Freq: Every day | INTRAMUSCULAR | Status: DC
  Administered 2016-10-11: 40 mg via INTRAVENOUS
  Filled 2016-10-10: qty 4

## 2016-10-10 MED ORDER — POTASSIUM CHLORIDE ER 10 MEQ PO TBCR
20.0000 meq | EXTENDED_RELEASE_TABLET | Freq: Two times a day (BID) | ORAL | Status: DC
  Administered 2016-10-10 – 2016-10-11 (×3): 20 meq via ORAL
  Filled 2016-10-10 (×6): qty 2

## 2016-10-10 NOTE — Progress Notes (Addendum)
Patient with no complaints or concerns during 7pm - 7am shift. CBG last night 96. Will continue to monitor.   Shavontae Gibeault, RN

## 2016-10-10 NOTE — Progress Notes (Signed)
Ref: Ricki RodriguezKADAKIA,Shironda Kain S, MD   Subjective:  Feeling better.   Objective:  Vital Signs in the last 24 hours: Temp:  [97.7 F (36.5 C)-97.9 F (36.6 C)] 97.9 F (36.6 C) (03/25 0542) Pulse Rate:  [69-77] 76 (03/25 0542) Cardiac Rhythm: Normal sinus rhythm (03/25 0700) Resp:  [18-20] 18 (03/25 0542) BP: (120-141)/(63-75) 120/67 (03/25 1112) SpO2:  [96 %-99 %] 96 % (03/25 0542) Weight:  [103 kg (227 lb 1.6 oz)] 103 kg (227 lb 1.6 oz) (03/25 0542)  Physical Exam: BP Readings from Last 1 Encounters:  10/10/16 120/67    Wt Readings from Last 1 Encounters:  10/10/16 103 kg (227 lb 1.6 oz)    Weight change: -2.495 kg (-5 lb 8 oz) Body mass index is 31.67 kg/m. HEENT: [place]/AT, Eyes-Brown, PERL, EOMI, Conjunctiva-Pink, Sclera-Non-icteric Neck: No JVD, No bruit, Trachea midline. Lungs:  Clearing, Bilateral. Cardiac:  Regular rhythm, normal S1 and S2, no S3. II/VI systolic murmur. Abdomen:  Soft, non-tender. BS present. Extremities:  2 + edema present. No cyanosis. No clubbing. CNS: AxOx3, Cranial nerves grossly intact, moves all 4 extremities.  Skin: Warm and dry.   Intake/Output from previous day: 03/24 0701 - 03/25 0700 In: 600 [P.O.:600] Out: 2500 [Urine:2500]    Lab Results: BMET    Component Value Date/Time   NA 141 10/10/2016 0510   NA 141 10/09/2016 0046   NA 142 10/08/2016 1410   K 3.7 10/10/2016 0510   K 4.2 10/09/2016 0046   K 4.3 10/08/2016 1410   CL 106 10/10/2016 0510   CL 106 10/09/2016 0046   CL 108 10/08/2016 1410   CO2 28 10/10/2016 0510   CO2 28 10/09/2016 0046   CO2 27 10/08/2016 1410   GLUCOSE 85 10/10/2016 0510   GLUCOSE 139 (H) 10/09/2016 0046   GLUCOSE 138 (H) 10/08/2016 1410   BUN 23 (H) 10/10/2016 0510   BUN 23 (H) 10/09/2016 0046   BUN 23 (H) 10/08/2016 1410   CREATININE 1.72 (H) 10/10/2016 0510   CREATININE 1.66 (H) 10/09/2016 0046   CREATININE 1.67 (H) 10/08/2016 1410   CALCIUM 8.4 (L) 10/10/2016 0510   CALCIUM 8.5 (L) 10/09/2016 0046   CALCIUM 8.4 (L) 10/08/2016 1410   GFRNONAA 41 (L) 10/10/2016 0510   GFRNONAA 43 (L) 10/09/2016 0046   GFRNONAA 42 (L) 10/08/2016 1410   GFRAA 47 (L) 10/10/2016 0510   GFRAA 49 (L) 10/09/2016 0046   GFRAA 49 (L) 10/08/2016 1410   CBC    Component Value Date/Time   WBC 4.8 10/08/2016 1410   RBC 3.34 (L) 10/08/2016 1410   HGB 10.5 (L) 10/08/2016 1410   HCT 31.5 (L) 10/08/2016 1410   PLT 180 10/08/2016 1410   MCV 94.3 10/08/2016 1410   MCH 31.4 10/08/2016 1410   MCHC 33.3 10/08/2016 1410   RDW 15.9 (H) 10/08/2016 1410   LYMPHSABS 1.2 10/08/2016 1410   MONOABS 0.5 10/08/2016 1410   EOSABS 0.2 10/08/2016 1410   BASOSABS 0.0 10/08/2016 1410   HEPATIC Function Panel  Recent Labs  10/08/16 1410  PROT 6.3*   HEMOGLOBIN A1C No components found for: HGA1C,  MPG CARDIAC ENZYMES Lab Results  Component Value Date   TROPONINI <0.03 10/09/2016   TROPONINI <0.03 10/08/2016   TROPONINI <0.03 10/08/2016   BNP No results for input(s): PROBNP in the last 8760 hours. TSH No results for input(s): TSH in the last 8760 hours. CHOLESTEROL No results for input(s): CHOL in the last 8760 hours.  Scheduled Meds: . allopurinol  100 mg Oral Daily  . aspirin EC  81 mg Oral Daily  . calcium acetate  667 mg Oral BID WC  . carvedilol  12.5 mg Oral BID WC  . colchicine  0.6 mg Oral Daily  . [START ON 10/11/2016] furosemide  40 mg Intravenous Daily  . heparin  5,000 Units Subcutaneous Q8H  . insulin aspart  0-15 Units Subcutaneous TID WC  . insulin aspart protamine- aspart  16 Units Subcutaneous Q breakfast  . irbesartan  150 mg Oral Daily  . isoniazid  300 mg Oral Daily  . isosorbide-hydrALAZINE  1 tablet Oral BID  . metFORMIN  500 mg Oral BID WC  . potassium chloride  20 mEq Oral BID  . rifampin  600 mg Oral Daily  . rosuvastatin  10 mg Oral q1800  . sodium chloride flush  3 mL Intravenous Q12H  . 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   LOS: 2 days    Orpah Cobb  MD  10/10/2016, 11:16 AM

## 2016-10-11 ENCOUNTER — Inpatient Hospital Stay (HOSPITAL_COMMUNITY): Payer: BLUE CROSS/BLUE SHIELD

## 2016-10-11 LAB — BASIC METABOLIC PANEL
ANION GAP: 10 (ref 5–15)
BUN: 27 mg/dL — ABNORMAL HIGH (ref 6–20)
CHLORIDE: 105 mmol/L (ref 101–111)
CO2: 27 mmol/L (ref 22–32)
Calcium: 8.6 mg/dL — ABNORMAL LOW (ref 8.9–10.3)
Creatinine, Ser: 1.83 mg/dL — ABNORMAL HIGH (ref 0.61–1.24)
GFR calc non Af Amer: 38 mL/min — ABNORMAL LOW (ref >60)
GFR, EST AFRICAN AMERICAN: 44 mL/min — AB (ref >60)
GLUCOSE: 99 mg/dL (ref 65–99)
POTASSIUM: 4.5 mmol/L (ref 3.5–5.1)
Sodium: 142 mmol/L (ref 135–145)

## 2016-10-11 LAB — GLUCOSE, CAPILLARY
GLUCOSE-CAPILLARY: 63 mg/dL — AB (ref 65–99)
GLUCOSE-CAPILLARY: 105 mg/dL — AB (ref 65–99)
GLUCOSE-CAPILLARY: 81 mg/dL (ref 65–99)
Glucose-Capillary: 128 mg/dL — ABNORMAL HIGH (ref 65–99)
Glucose-Capillary: 96 mg/dL (ref 65–99)
Glucose-Capillary: 108 mg/dL — ABNORMAL HIGH (ref 65–99)
Glucose-Capillary: 73 mg/dL (ref 65–99)

## 2016-10-11 MED ORDER — FUROSEMIDE 40 MG PO TABS
40.0000 mg | ORAL_TABLET | Freq: Every day | ORAL | Status: DC
  Administered 2016-10-12: 40 mg via ORAL
  Filled 2016-10-11: qty 1

## 2016-10-11 MED ORDER — DEXTROSE-NACL 5-0.45 % IV SOLN
INTRAVENOUS | Status: AC
  Administered 2016-10-11: 19:00:00 via INTRAVENOUS

## 2016-10-11 MED ORDER — POTASSIUM CHLORIDE CRYS ER 10 MEQ PO TBCR
10.0000 meq | EXTENDED_RELEASE_TABLET | Freq: Every day | ORAL | Status: DC
  Administered 2016-10-12: 10 meq via ORAL
  Filled 2016-10-11: qty 1

## 2016-10-11 NOTE — Progress Notes (Signed)
   10/10/16 2201  Vitals  Ectopy Ventricular tachycardia, non-sustained (11 BEAT RUN )  Pt resting in bed, asymptomatic. Dr. Algie CofferKadakia made aware. Will continue to monitor.

## 2016-10-11 NOTE — Progress Notes (Signed)
Infection prevention contacted to assess if patient is to be on airborne precautions due to TB blood test order today. Per IP, patient does not need to be on airborne for this testing.

## 2016-10-11 NOTE — Progress Notes (Signed)
Ref: Ricki Rodriguez, MD   Subjective:  Episode of hypoglycemia. on Anti-tuberculum medication for pericardial effusion but testing on fluid was negative for TB.   Objective:  Vital Signs in the last 24 hours: Temp:  [97.5 F (36.4 C)-97.6 F (36.4 C)] 97.5 F (36.4 C) (03/26 0517) Pulse Rate:  [70-77] 70 (03/26 1208) Cardiac Rhythm: Normal sinus rhythm (03/26 0815) Resp:  [18] 18 (03/26 1208) BP: (122-140)/(63-82) 122/63 (03/26 1208) SpO2:  [94 %-100 %] 100 % (03/26 1208) Weight:  [102.8 kg (226 lb 11.2 oz)] 102.8 kg (226 lb 11.2 oz) (03/26 0517)  Physical Exam: BP Readings from Last 1 Encounters:  10/11/16 122/63    Wt Readings from Last 1 Encounters:  10/11/16 102.8 kg (226 lb 11.2 oz)    Weight change: -0.181 kg (-6.4 oz) Body mass index is 31.62 kg/m. HEENT: [place]/AT, Eyes-Brown, PERL, EOMI, Conjunctiva-Pink, Sclera-Non-icteric Neck: No JVD, No bruit, Trachea midline. Lungs:  Clear, Bilateral. Cardiac:  Regular rhythm, normal S1 and S2, no S3. II/VI systolic murmur. Abdomen:  Soft, non-tender. BS present. Extremities:  1 + edema present. No cyanosis. No clubbing. CNS: AxOx3, Cranial nerves grossly intact, moves all 4 extremities.  Skin: Warm and dry.   Intake/Output from previous day: 03/25 0701 - 03/26 0700 In: 960 [P.O.:960] Out: 1600 [Urine:1600]    Lab Results: BMET    Component Value Date/Time   NA 142 10/11/2016 0546   NA 141 10/10/2016 0510   NA 141 10/09/2016 0046   K 4.5 10/11/2016 0546   K 3.7 10/10/2016 0510   K 4.2 10/09/2016 0046   CL 105 10/11/2016 0546   CL 106 10/10/2016 0510   CL 106 10/09/2016 0046   CO2 27 10/11/2016 0546   CO2 28 10/10/2016 0510   CO2 28 10/09/2016 0046   GLUCOSE 99 10/11/2016 0546   GLUCOSE 85 10/10/2016 0510   GLUCOSE 139 (H) 10/09/2016 0046   BUN 27 (H) 10/11/2016 0546   BUN 23 (H) 10/10/2016 0510   BUN 23 (H) 10/09/2016 0046   CREATININE 1.83 (H) 10/11/2016 0546   CREATININE 1.72 (H) 10/10/2016 0510   CREATININE 1.66 (H) 10/09/2016 0046   CALCIUM 8.6 (L) 10/11/2016 0546   CALCIUM 8.4 (L) 10/10/2016 0510   CALCIUM 8.5 (L) 10/09/2016 0046   GFRNONAA 38 (L) 10/11/2016 0546   GFRNONAA 41 (L) 10/10/2016 0510   GFRNONAA 43 (L) 10/09/2016 0046   GFRAA 44 (L) 10/11/2016 0546   GFRAA 47 (L) 10/10/2016 0510   GFRAA 49 (L) 10/09/2016 0046   CBC    Component Value Date/Time   WBC 4.8 10/08/2016 1410   RBC 3.34 (L) 10/08/2016 1410   HGB 10.5 (L) 10/08/2016 1410   HCT 31.5 (L) 10/08/2016 1410   PLT 180 10/08/2016 1410   MCV 94.3 10/08/2016 1410   MCH 31.4 10/08/2016 1410   MCHC 33.3 10/08/2016 1410   RDW 15.9 (H) 10/08/2016 1410   LYMPHSABS 1.2 10/08/2016 1410   MONOABS 0.5 10/08/2016 1410   EOSABS 0.2 10/08/2016 1410   BASOSABS 0.0 10/08/2016 1410   HEPATIC Function Panel  Recent Labs  10/08/16 1410  PROT 6.3*   HEMOGLOBIN A1C No components found for: HGA1C,  MPG CARDIAC ENZYMES Lab Results  Component Value Date   TROPONINI <0.03 10/09/2016   TROPONINI <0.03 10/08/2016   TROPONINI <0.03 10/08/2016   BNP No results for input(s): PROBNP in the last 8760 hours. TSH No results for input(s): TSH in the last 8760 hours. CHOLESTEROL No results for input(s):  CHOL in the last 8760 hours.  Scheduled Meds: . allopurinol  100 mg Oral Daily  . aspirin EC  81 mg Oral Daily  . calcium acetate  667 mg Oral BID WC  . carvedilol  12.5 mg Oral BID WC  . colchicine  0.6 mg Oral Daily  . furosemide  40 mg Intravenous Daily  . heparin  5,000 Units Subcutaneous Q8H  . insulin aspart  0-15 Units Subcutaneous TID WC  . irbesartan  150 mg Oral Daily  . isoniazid  300 mg Oral Daily  . isosorbide-hydrALAZINE  1 tablet Oral BID  . [START ON 10/12/2016] potassium chloride  10 mEq Oral Daily  . rifampin  600 mg Oral Daily  . rosuvastatin  10 mg Oral q1800  . sodium chloride flush  3 mL Intravenous Q12H  . spironolactone  12.5 mg Oral BID   Continuous Infusions: . dextrose 5 % and 0.45%  NaCl 25 mL/hr at 10/11/16 1832   PRN Meds:.sodium chloride, acetaminophen, ondansetron (ZOFRAN) IV, sodium chloride flush  Assessment/Plan: Acute on chronic left heart systolic failure DM, II Acute renal failure on CKD due to DM, II and hypertension Obesity CAD S/P stent Chronic pericardial effusio  Hold diabetic medications. Quantiferon tb gold assay Air-borne precaution   LOS: 3 days    Orpah CobbAjay Dalma Panchal  MD  10/11/2016, 7:30 PM

## 2016-10-11 NOTE — Progress Notes (Signed)
Hypoglycemic Event  CBG: 63  Treatment: 15 GM carbohydrate snack  Symptoms: None  Follow-up CBG: Time: 1228 CBG Result:73  Possible Reasons for Event: Unknown  Comments/MD notified: Dr. Algie CofferKadakia notified; states to recheck CBG in one hour.    Caleb [name], Caleb [name]

## 2016-10-11 NOTE — Progress Notes (Signed)
  Echocardiogram 2D Echocardiogram has been performed.  Caleb SavoyCasey N Julien [name] 10/11/2016, 11:30 AM

## 2016-10-12 ENCOUNTER — Inpatient Hospital Stay (HOSPITAL_COMMUNITY): Payer: BLUE CROSS/BLUE SHIELD

## 2016-10-12 LAB — COMPREHENSIVE METABOLIC PANEL
ALK PHOS: 190 U/L — AB (ref 38–126)
ALT: 13 U/L — AB (ref 17–63)
AST: 35 U/L (ref 15–41)
Albumin: 3.1 g/dL — ABNORMAL LOW (ref 3.5–5.0)
Anion gap: 11 (ref 5–15)
BUN: 29 mg/dL — ABNORMAL HIGH (ref 6–20)
CALCIUM: 8.6 mg/dL — AB (ref 8.9–10.3)
CO2: 24 mmol/L (ref 22–32)
CREATININE: 1.8 mg/dL — AB (ref 0.61–1.24)
Chloride: 105 mmol/L (ref 101–111)
GFR calc non Af Amer: 39 mL/min — ABNORMAL LOW (ref >60)
GFR, EST AFRICAN AMERICAN: 45 mL/min — AB (ref >60)
Glucose, Bld: 92 mg/dL (ref 65–99)
Potassium: 4.9 mmol/L (ref 3.5–5.1)
SODIUM: 140 mmol/L (ref 135–145)
Total Bilirubin: 2.5 mg/dL — ABNORMAL HIGH (ref 0.3–1.2)
Total Protein: 6.1 g/dL — ABNORMAL LOW (ref 6.5–8.1)

## 2016-10-12 LAB — CBC
HCT: 31.9 % — ABNORMAL LOW (ref 39.0–52.0)
HEMOGLOBIN: 10.5 g/dL — AB (ref 13.0–17.0)
MCH: 31.3 pg (ref 26.0–34.0)
MCHC: 32.9 g/dL (ref 30.0–36.0)
MCV: 94.9 fL (ref 78.0–100.0)
Platelets: 182 10*3/uL (ref 150–400)
RBC: 3.36 MIL/uL — AB (ref 4.22–5.81)
RDW: 16.2 % — ABNORMAL HIGH (ref 11.5–15.5)
WBC: 3.9 10*3/uL — ABNORMAL LOW (ref 4.0–10.5)

## 2016-10-12 LAB — GLUCOSE, CAPILLARY
GLUCOSE-CAPILLARY: 72 mg/dL (ref 65–99)
GLUCOSE-CAPILLARY: 95 mg/dL (ref 65–99)
GLUCOSE-CAPILLARY: 196 mg/dL — AB (ref 65–99)
Glucose-Capillary: 139 mg/dL — ABNORMAL HIGH (ref 65–99)

## 2016-10-12 MED ORDER — FUROSEMIDE 40 MG PO TABS: 40.0000 mg | ORAL_TABLET | Freq: Every day | ORAL | 3 refills | Status: AC

## 2016-10-12 MED ORDER — ALLOPURINOL 100 MG PO TABS: 100.0000 mg | ORAL_TABLET | Freq: Every day | ORAL | Status: AC

## 2016-10-12 MED ORDER — POTASSIUM CHLORIDE CRYS ER 10 MEQ PO TBCR: 10.0000 meq | EXTENDED_RELEASE_TABLET | ORAL | 3 refills | Status: AC

## 2016-10-12 MED ORDER — SPIRONOLACTONE 25 MG PO TABS: 12.5000 mg | ORAL_TABLET | Freq: Two times a day (BID) | ORAL | 3 refills | Status: AC

## 2016-10-12 MED ORDER — INSULIN ASPART PROT & ASPART (70-30 MIX) 100 UNIT/ML ~~LOC~~ SUSP: 5.0000 [IU] | Freq: Two times a day (BID) | SUBCUTANEOUS | 11 refills | Status: AC

## 2016-10-12 MED ORDER — COLCHICINE 0.6 MG PO TABS: 0.6000 mg | ORAL_TABLET | Freq: Every day | ORAL | 1 refills | Status: AC

## 2016-10-12 MED ORDER — DIGOXIN 250 MCG PO TABS: 0.1250 mg | ORAL_TABLET | Freq: Every day | ORAL | Status: AC

## 2016-10-12 NOTE — Progress Notes (Signed)
IP notified that health department is requesting that patient is to have AFB x3 done. Patient has not been coughing. IP states that patient DOES NOT require airborne precautions. Dr. Algie CofferKadakia notified and states he will not order AFB, nor a bronchoscopy.

## 2016-10-12 NOTE — Discharge Summary (Signed)
Physician Discharge Summary  Patient ID: Caleb [name] MRN: [id number] DOB/AGE: 21-Jan-1956 [age] y.o.  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 He responded to IV lasix and PO spironolactone. His renal function slightly worsened but his pericardial effusion lessened and his shortness of breath improved.  Patient also revealed he is in his last month of Isoniazid and Rifampin x 6 month for suspected TB but negative testing results. He did not had cough or sputum production or x-ray finding of cavitary lesion or weight loss or fever. His Quantiferon tb gold assay is pending. Patient agrees to follow heart failure book-let instructions. His diabetic medication, metformin was discontinued and Insulin dose was decreased to 5 units twice daily and adjust as needed by sliding scale. He will see me in one week.   Consults: cardiology  Significant Diagnostic Studies: labs: Normal electrolytes, BUN 23 to 27 and creatinine 1.67 to 1.83. Hgb A1C of 6.3. BNP: 2,679.5.  Troponin-I negative x 3. HIV: non Reactive. Quantiferon TB gold assay: pending.  EKG: Sinus rhythm, low voltage, left axis deviation, left anterior fascicular block, old inferior and antero-lateral infarct.  CXR: Cardiomegaly with vascular congestion.   Echocardiogram: Moderate LV systolic dysfunction. Mild AI, MR, severe TR moderate pulmonary hypertension, chronic pericardial effusion improving on follow-up imaging.  Ultrasound of abdomen: Thickened gall bladder wall and multiple bilateral renal cysts.  Treatments: cardiac meds: aspirin, carvedilol, Entresto, digoxin, furosemide, potassium, spironolactone and rosuvastatin  Discharge Exam: Blood pressure (!) 151/80, pulse 77, temperature 97.7 F (36.5 C), temperature source Oral, resp. rate 18, height 5\' 11"  (1.803 m), weight 102.5 kg (226 lb), SpO2 98 %. General appearance: alert, cooperative and appears stated age Head: Normocephalic, atraumatic. Eyes: Brown eyes, pink, conjunctivae/corneas clear. PERRL, EOM's intact.  Neck: no adenopathy, no carotid bruit, no JVD, supple, symmetrical, trachea midline and thyroid not enlarged. Resp: clear to auscultation bilaterally. Cardio: regular rate and rhythm, S1, S2 normal, no murmur, click, rub or gallop. GI: soft, non-tender; bowel sounds normal; no masses,  no organomegaly. Extremities: No cyanosis. 1 + lower leg edema Skin: Warm and dry. No rashes or lesions Neurologic: Alert and oriented X 3, normal strength and tone. Normal coordination and gait.  Disposition: 01-Home or Self Care   Allergies as of 10/12/2016      Reactions   Sulfa Antibiotics Hives   Childhood allergy. Gave him black spots on his skin      Medication List    STOP taking these medications   metFORMIN 500 MG tablet Commonly known as:  GLUCOPHAGE   torsemide 10 MG tablet Commonly known as:  DEMADEX  TAKE these medications   allopurinol 100 MG tablet Commonly known as:  ZYLOPRIM Take 1 tablet (100 mg  total) by mouth daily. (Zyloric) What changed:  when to take this   aspirin EC 81 MG tablet Take 75 mg by mouth daily. With lunch. Pt has 75mg  tab (Loprin 75)   calcium acetate 667 MG capsule Commonly known as:  PHOSLO Take 667 mg by mouth 2 (two) times daily with a meal. Takes 1 with lunch and 1 with dinner   carvedilol 12.5 MG tablet Commonly known as:  COREG Take 12.5 mg by mouth 2 (two) times daily with a meal. (Carloc)   colchicine 0.6 MG tablet Take 1 tablet (0.6 mg total) by mouth daily. Start taking on:  10/13/2016   digoxin 0.25 MG tablet Commonly known as:  LANOXIN Take 0.5 tablets (0.125 mg total) by mouth daily. (Lanoxin) What changed:  how much to take   ENTRESTO 97-103 MG Generic drug:  sacubitril-valsartan Take 1 tablet by mouth 2 (two) times daily. Patient's tabs say Sacubitril/Valsartan 100mg  (brand name Azmarda)   furosemide 40 MG tablet Commonly known as:  LASIX Take 1 tablet (40 mg total) by mouth daily. Start taking on:  10/13/2016   insulin aspart protamine- aspart (70-30) 100 UNIT/ML injection Commonly known as:  NOVOLOG MIX 70/30 Inject 0.05 mLs (5 Units total) into the skin 2 (two) times daily with a meal. What changed:  how much to take  when to take this   potassium chloride 10 MEQ tablet Commonly known as:  K-DUR,KLOR-CON Take 1 tablet (10 mEq total) by mouth every Monday, Wednesday, and Friday. Start taking on:  10/13/2016   rifampin 150 MG capsule Commonly known as:  RIFADIN Take 450 mg by mouth daily. Pills say Rifampicin (R-Cin 450)   rosuvastatin 10 MG tablet Commonly known as:  CRESTOR Take 10 mg by mouth at bedtime. (Rozavel)   spironolactone 25 MG tablet Commonly known as:  ALDACTONE Take 0.5 tablets (12.5 mg total) by mouth 2 (two) times daily.      Follow-up Information    [facility] S, MD. Schedule an appointment as soon as possible for a visit on 10/20/2016.   Specialty:  Cardiology Why:  At 15:30 PM. Contact  information: [address] [place] [address] [phone]           Signed: Ricki Rodriguez 10/12/2016, 5:38 PM

## 2016-10-12 NOTE — Care Management Note (Signed)
Case Management Note  Patient Details  Name: Caleb [name] MRN: [id number] Date of Birth: 04-10-1956  Subjective/Objective:                 Spoke with patient at the bedside. He states he lives at home alone, [place] daughter in [name]. He describes himself as independent, ambulated without assistance or DME, drives. Has coverage for meds through Medicare, denies difficulties obtaining meds. Patient counseled about daily weights, states he has a scale for use at home.  [facility] [address]   Action/Plan:  No further CM needs identified at this time.   Expected Discharge Date:  10/11/16               Expected Discharge Plan:  Home/Self Care  In-House Referral:     Discharge planning Services  CM Consult  Post Acute Care Choice:    Choice offered to:     DME Arranged:    DME Agency:     HH Arranged:    HH Agency:     Status of Service:  Completed, signed off  If discussed at [employer] Length of Stay Meetings, dates discussed:    Additional Comments:  Lawerance SabalDebbie Noralee Dutko, RN 10/12/2016, 10:40 AM

## 2016-10-12 NOTE — Progress Notes (Signed)
Per Pt, bath has been done.

## 2016-10-12 NOTE — Progress Notes (Signed)
Pt has orders to be discharged. Discharge instructions given and pt has no additional questions at this time. Medication regimen reviewed and pt educated. Pt verbalized understanding and has no additional questions. Telemetry box removed. IV removed and site in good condition. Pt stable and waiting for transportation. 

## 2016-10-14 LAB — ECHOCARDIOGRAM LIMITED
Height: 71 in
WEIGHTICAEL: 3627.2 [oz_av]

## 2016-10-14 LAB — QUANTIFERON IN TUBE
QFT TB AG MINUS NIL VALUE: 0.05 IU/mL
QUANTIFERON MITOGEN VALUE: >10 IU/mL
QUANTIFERON NIL VALUE: 0.03 [IU]/mL
QUANTIFERON TB AG VALUE: 0.08 [IU]/mL
QUANTIFERON TB GOLD: NEGATIVE

## 2016-10-14 LAB — QUANTIFERON TB GOLD ASSAY (BLOOD)

## 2017-10-17 DEATH — deceased

## 2018-03-02 IMAGING — US US ABDOMEN COMPLETE
1 series · 13 of 25 positions shown · non-contrast
Comparison: None.

CLINICAL DATA: Abnormal liver function tests, diabetes,
hypertension

EXAM:
ABDOMEN ULTRASOUND COMPLETE

[Series 1: us abdomen complete · 0.22mm/px · 13 of 79 slices shown]
[im 1/79]
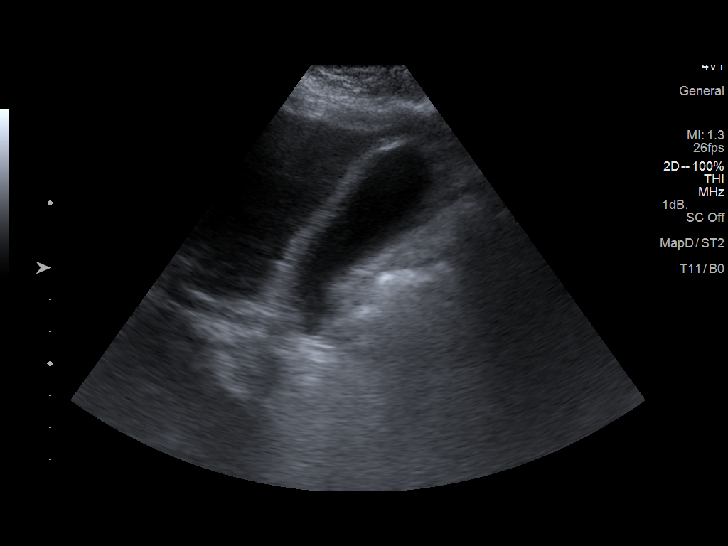
[im 7/79]
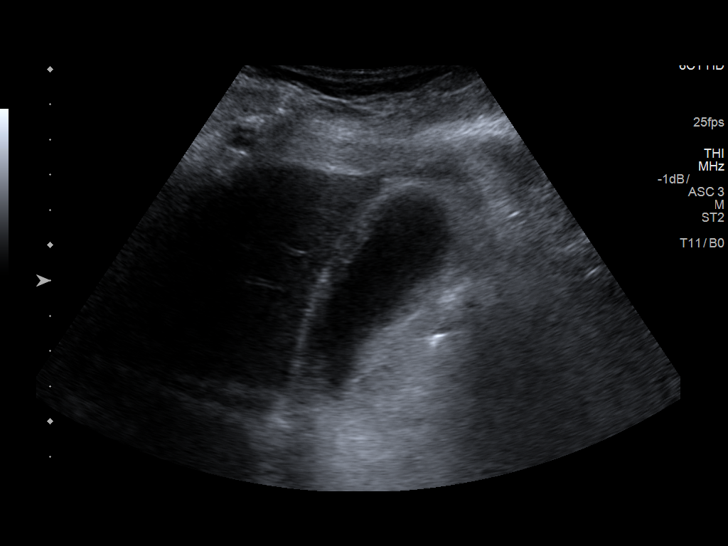
[im 14/79]
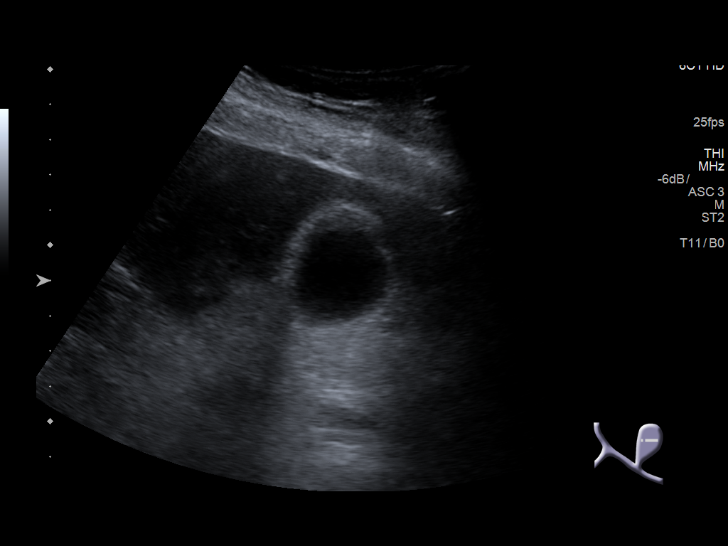
[im 20/79]
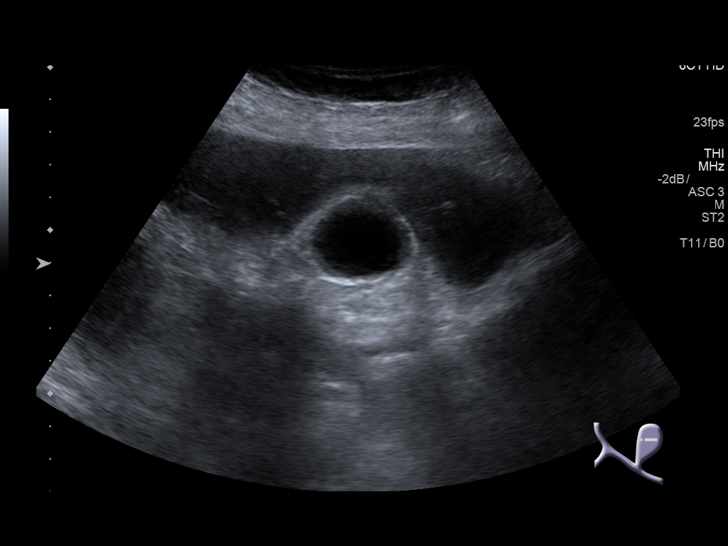
[im 27/79]
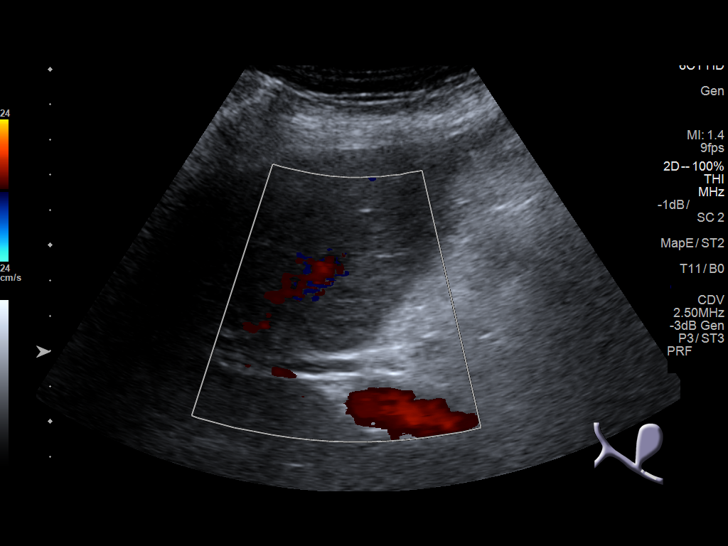
[im 33/79]
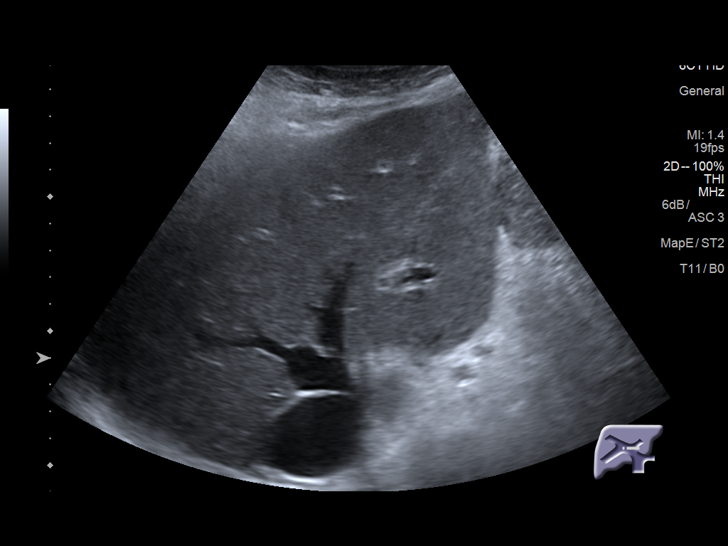
[im 40/79]
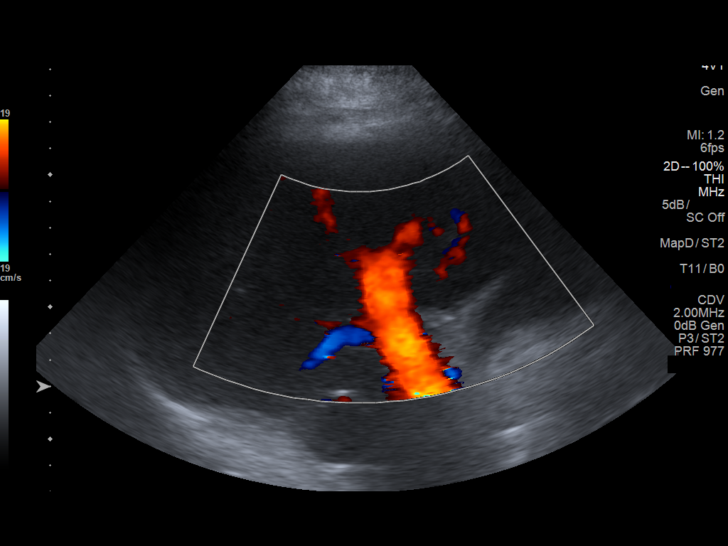
[im 46/79]
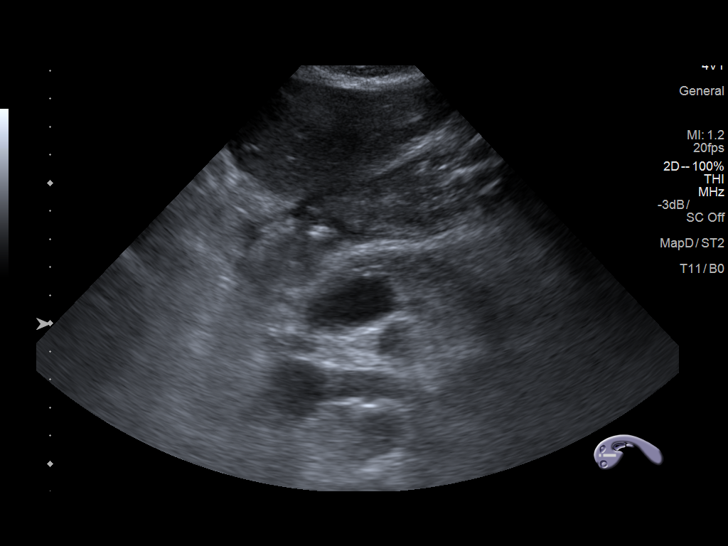
[im 53/79]
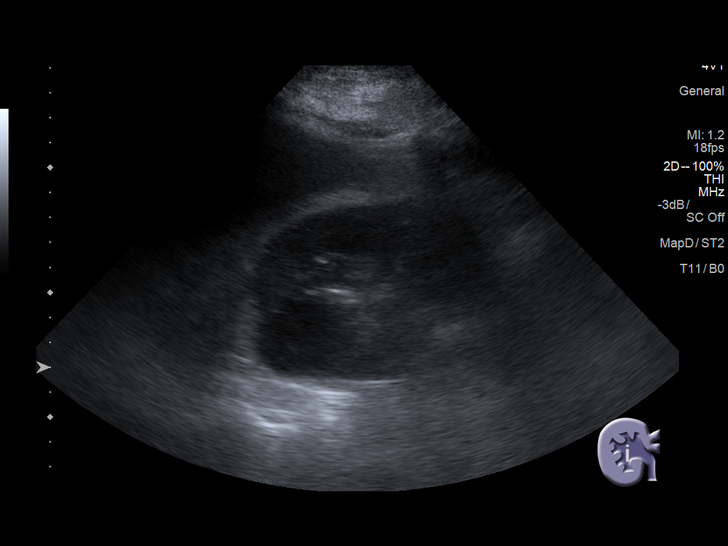
[im 59/79]
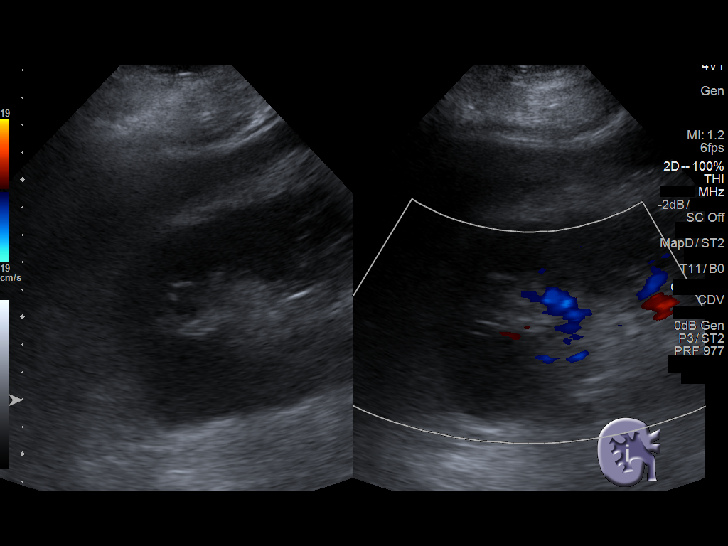
[im 66/79]
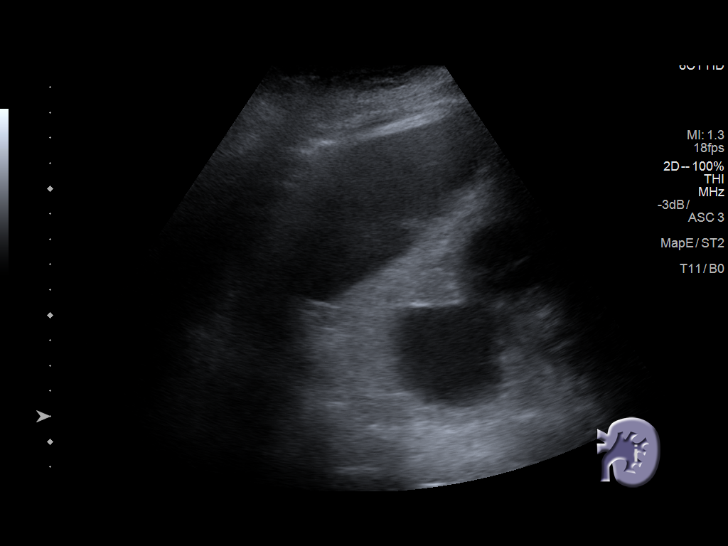
[im 72/79]
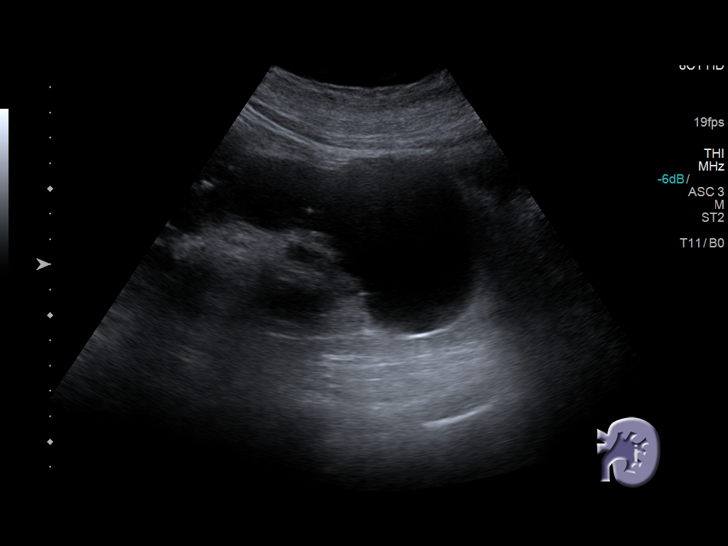
[im 79/79]
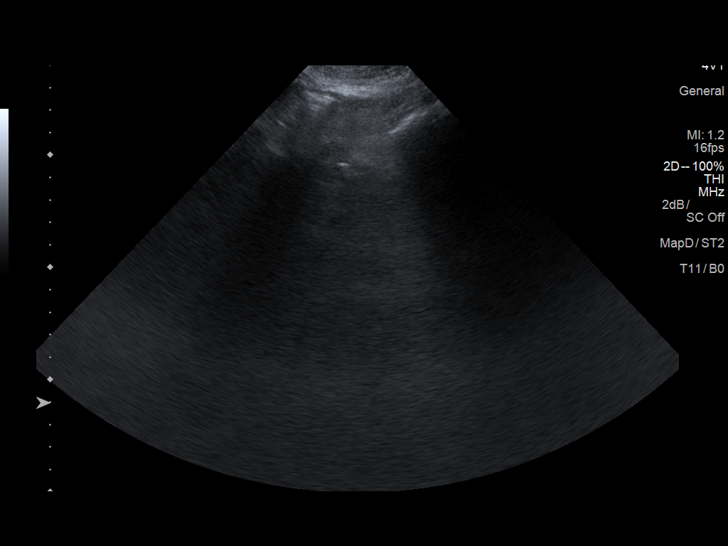

[13 of 25 positions shown; findings below may reference images not displayed]

FINDINGS: Gallbladder: The gallbladder is visualized and no gallstones are
noted. However, the gallbladder wall is thickened measuring up to
6.1 mm. This is a nonspecific finding and can be seen with acute
cholecystitis possibly a calculus cholecystitis, hepatitis,
cirrhosis, congestive heart failure and other entities. However,
there is no pain over the gallbladder currently.

Common bile duct: Diameter: The common bile duct is normal measuring
4.9 mm in diameter.

Liver: The liver has a normal parenchymal echogenicity. No focal
hepatic abnormality is seen

IVC: No abnormality is seen.

Pancreas: Only the mid body the pancreas is visualized and appears
normal. The head and tail of the pancreas are obscured by bowel gas.

Spleen: The spleen measures 6.3 cm in diameter.

Right Kidney: Length: 13.2 cm.. No hydronephrosis is noted. Several
right renal cysts are present the largest of 4.5 cm in diameter.

Left Kidney: Length: 12.6 cm.. No hydronephrosis is seen. Renal
cysts are noted the largest of 7.2 cm.

Abdominal aorta: The abdominal aorta is normal in caliber although
obscured distally by bowel gas.

Other findings: None.
IMPRESSION: 1. Diffusely thickened gallbladder wall. This finding can be due to
multiple causes, as acalculus cholecystitis in view of no gallstones
being detected, hepatitis, cirrhosis, CHF, as well as multiple other
entities. Clinical correlation is recommended. There is no pain over
the gallbladder.
2. The midportion of the pancreas is normal with the head and body
obscured by bowel gas.
3. Multiple bilateral renal cysts.  No hydronephrosis.
